# Patient Record
Sex: Female | Born: 1950 | Race: White | Hispanic: No | Marital: Single | Smoking: Never smoker
Health system: Southern US, Community
[De-identification: ages and names within clinical notes are randomized; demographics above are authoritative.]

## PROBLEM LIST (undated history)

## (undated) DIAGNOSIS — F419 Anxiety disorder, unspecified: Secondary | ICD-10-CM

## (undated) DIAGNOSIS — I8390 Asymptomatic varicose veins of unspecified lower extremity: Secondary | ICD-10-CM

## (undated) DIAGNOSIS — M1990 Unspecified osteoarthritis, unspecified site: Secondary | ICD-10-CM

## (undated) DIAGNOSIS — G629 Polyneuropathy, unspecified: Secondary | ICD-10-CM

## (undated) DIAGNOSIS — I10 Essential (primary) hypertension: Secondary | ICD-10-CM

## (undated) DIAGNOSIS — K649 Unspecified hemorrhoids: Secondary | ICD-10-CM

## (undated) DIAGNOSIS — Z8489 Family history of other specified conditions: Secondary | ICD-10-CM

## (undated) DIAGNOSIS — T4145XA Adverse effect of unspecified anesthetic, initial encounter: Secondary | ICD-10-CM

## (undated) DIAGNOSIS — T8859XA Other complications of anesthesia, initial encounter: Secondary | ICD-10-CM

## (undated) DIAGNOSIS — K219 Gastro-esophageal reflux disease without esophagitis: Secondary | ICD-10-CM

## (undated) DIAGNOSIS — R200 Anesthesia of skin: Secondary | ICD-10-CM

## (undated) DIAGNOSIS — I739 Peripheral vascular disease, unspecified: Secondary | ICD-10-CM

## (undated) DIAGNOSIS — E785 Hyperlipidemia, unspecified: Secondary | ICD-10-CM

## (undated) DIAGNOSIS — F32A Depression, unspecified: Secondary | ICD-10-CM

## (undated) DIAGNOSIS — F329 Major depressive disorder, single episode, unspecified: Secondary | ICD-10-CM

## (undated) HISTORY — DX: Hyperlipidemia, unspecified: E78.5

## (undated) HISTORY — DX: Anxiety disorder, unspecified: F41.9

## (undated) HISTORY — DX: Anesthesia of skin: R20.0

## (undated) HISTORY — DX: Asymptomatic varicose veins of unspecified lower extremity: I83.90

## (undated) HISTORY — DX: Unspecified osteoarthritis, unspecified site: M19.90

## (undated) HISTORY — DX: Essential (primary) hypertension: I10

## (undated) HISTORY — PX: MOUTH SURGERY: SHX715

## (undated) HISTORY — DX: Major depressive disorder, single episode, unspecified: F32.9

## (undated) HISTORY — DX: Unspecified hemorrhoids: K64.9

## (undated) HISTORY — DX: Depression, unspecified: F32.A

## (undated) HISTORY — DX: Polyneuropathy, unspecified: G62.9

---

## 2003-02-14 HISTORY — PX: COLONOSCOPY: SHX174

## 2013-09-04 ENCOUNTER — Other Ambulatory Visit: Payer: Self-pay | Admitting: *Deleted

## 2013-09-04 DIAGNOSIS — I83893 Varicose veins of bilateral lower extremities with other complications: Secondary | ICD-10-CM

## 2013-09-10 ENCOUNTER — Encounter: Payer: Self-pay | Admitting: Vascular Surgery

## 2013-11-12 ENCOUNTER — Encounter: Payer: Self-pay | Admitting: Vascular Surgery

## 2013-11-13 ENCOUNTER — Ambulatory Visit (INDEPENDENT_AMBULATORY_CARE_PROVIDER_SITE_OTHER): Payer: Medicare Other | Admitting: Vascular Surgery

## 2013-11-13 ENCOUNTER — Ambulatory Visit (HOSPITAL_COMMUNITY)
Admission: RE | Admit: 2013-11-13 | Discharge: 2013-11-13 | Disposition: A | Discharge status: Home / Self Care | Payer: BLUE CROSS/BLUE SHIELD | Source: Ambulatory Visit | Attending: Vascular Surgery | Admitting: Vascular Surgery

## 2013-11-13 ENCOUNTER — Encounter: Payer: Self-pay | Admitting: Vascular Surgery

## 2013-11-13 VITALS — BP 160/78 | HR 66 | Ht 66.0 in | Wt 152.0 lb

## 2013-11-13 DIAGNOSIS — I83893 Varicose veins of bilateral lower extremities with other complications: Secondary | ICD-10-CM

## 2013-11-13 DIAGNOSIS — I8393 Asymptomatic varicose veins of bilateral lower extremities: Secondary | ICD-10-CM

## 2013-11-13 NOTE — Progress Notes (Deleted)
[facility] [place] HISTORY AND PHYSICAL   History of Present Illness:  Patient is a 63 y.o. year old female who presents for evaluation of ***.  Other medical problems include  does not have a problem list on file.  Past Medical History  Diagnosis Date  . Varicose veins   . Hyperlipidemia   . Dog bite of calf     No past surgical history on file.  Social History History  Substance Use Topics  . Smoking status: Not on file  . Smokeless tobacco: Not on file  . Alcohol Use: Not on file    Family History No family history on file.  Allergies  Allergies no known allergies   Current Outpatient Prescriptions  Medication Sig Dispense Refill  . Calcium Carbonate (CALTRATE 600 PO) Take 1 tablet by mouth daily.      . pravastatin (PRAVACHOL) 10 MG tablet Take 10 mg by mouth daily.       No current facility-administered medications for this visit.    ROS:   General:  No weight loss, Fever, chills  HEENT: No recent headaches, no nasal bleeding, no visual changes, no sore throat  Neurologic: No dizziness, blackouts, seizures. No recent symptoms of stroke or mini- stroke. No recent episodes of slurred speech, or temporary blindness.  Cardiac: No recent episodes of chest pain/pressure, no shortness of breath at rest.  No shortness of breath with exertion.  Denies history of atrial fibrillation or irregular heartbeat  Vascular: No history of rest pain in feet.  No history of claudication.  No history of non-healing ulcer, No history of DVT   Pulmonary: No home oxygen, no productive cough, no hemoptysis,  No asthma or wheezing  Musculoskeletal:  [ ]  Arthritis, [ ]  Low back pain,  [ ]  Joint pain  Hematologic:No history of hypercoagulable state.  No history of easy bleeding.  No history of anemia  Gastrointestinal: No hematochezia or melena,  No gastroesophageal reflux, no trouble swallowing  Urinary: [ ]  chronic Kidney disease, [ ]  on HD - [ ]  MWF or [ ]   TTHS, [ ]  Burning with urination, [ ]  Frequent urination, [ ]  Difficulty urinating;   Skin: No rashes  Psychological: No history of anxiety,  No history of depression   Physical Examination  There were no vitals filed for this visit.  There is no height or weight on file to calculate BMI.  General:  Alert and oriented, no acute distress HEENT: Normal Neck: No bruit or JVD Pulmonary: Clear to auscultation bilaterally Cardiac: Regular Rate and Rhythm without murmur Abdomen: Soft, non-tender, non-distended, no mass, no scars Skin: No rash Extremity Pulses:  2+ radial, brachial, femoral, dorsalis pedis, posterior tibial pulses bilaterally Musculoskeletal: No deformity or edema  Neurologic: Upper and lower extremity motor 5/5 and symmetric  DATA:  ***   ASSESSMENT:  ***   PLAN:  ***  Ruta Hinds, MD [facility] [place] Office: [phone] Pager: [phone]

## 2013-11-13 NOTE — Progress Notes (Addendum)
HISTORY AND PHYSICAL     CC:  Trouble with legs, varicose veins, knee pain in right leg and restless at night.  Monico Blitz, MD  HPI: This is a 63 y.o. female who presents today for evaluation of her varicose veins.  She states that she was bitten by a dog several months ago on the right calf and her leg became red and swollen.  She went to see her PA and was told to elevate her leg and use cold compresses, which provided relief.  She states that she was also told to wear thigh high compression stockings and these are 15-20 mmHg.  She states that her right leg stays swollen and it is worse with standing.  She does have a family hx of varicose veins with her father and uncle.  She denies any hx of DVT.    She states that she does have high cholesterol and she is on a statin for this.  Past Medical History  Diagnosis Date  . Varicose veins   . Hyperlipidemia   . Dog bite of calf    History reviewed. No pertinent past surgical history.  No Known Allergies  Current Outpatient Prescriptions  Medication Sig Dispense Refill  . Calcium Carbonate (CALTRATE 600 PO) Take 1 tablet by mouth daily.      Marland Kitchen ibuprofen (ADVIL,MOTRIN) 200 MG tablet Take 200 mg by mouth every 6 (six) hours as needed.      . pravastatin (PRAVACHOL) 10 MG tablet Take 10 mg by mouth daily.       No current facility-administered medications for this visit.    Family History  Problem Relation Age of Onset  . Deep vein thrombosis Father   . Heart disease Father   . Hyperlipidemia Father   . Varicose Veins Father   . Heart attack Father   . Heart disease Mother   . Hypertension Mother   . Heart attack Mother   . Heart disease Sister     before age 110  . Hypertension Sister   . Heart attack Sister     History   Social History  . Marital Status: Single    Spouse Name: N/A    Number of Children: N/A  . Years of Education: N/A   Occupational History  . Not on file.   Social History Main Topics  .  Smoking status: Never Smoker   . Smokeless tobacco: Not on file  . Alcohol Use: No  . Drug Use: No  . Sexual Activity: Not on file   Other Topics Concern  . Not on file   Social History Narrative  . No narrative on file     ROS: [x]  Positive   [ ]  Negative   [ ]  All sytems reviewed and are negative  Cardiovascular: []  chest pain/pressure []  palpitations []  SOB lying flat []  DOE [x]  pain in legs while walking [x]  pain in feet when lying flat []  hx of DVT []  hx of phlebitis []  swelling in legs [x]  varicose veins  Pulmonary: []  productive cough []  asthma []  wheezing  Neurologic: []  weakness in []  arms []  legs xnumbness in []  arms [x]  right leg [] difficulty speaking or slurred speech []  temporary loss of vision in one eye []  dizziness  Hematologic: []  bleeding problems []  problems with blood clotting easily  GI []  vomiting blood []  blood in stool  GU: []  burning with urination []  blood in urine  Psychiatric: []  hx of major depression  Integumentary: []  rashes []   ulcers  Constitutional: []  fever []  chills   PHYSICAL EXAMINATION:  Filed Vitals:   11/13/13 1448  BP: 160/78  Pulse: 66   Body mass index is 24.55 kg/(m^2).  General:  WDWN in NAD Gait: normal gait HENT: WNL, normocephalic Pulmonary: normal non-labored breathing , without Rales, rhonchi,  wheezing Cardiac: RRR, without  Murmurs, rubs or gallops; without carotid bruits Abdomen: soft, NT, no masses Skin: without rashes, without ulcers  Vascular Exam/Pulses:  Right Left  Radial 2+ (normal) 2+ (normal)  Femoral 2+ (normal) 2+ (normal)  Popliteal Not palpated Not palpated  DP 2+ (normal) 2+ (normal)  PT 2+ (normal) absent   Extremities: without ischemic changes, without Gangrene , without cellulitis; without open wounds; right medial thigh and calf with 4-63mm varicosities; left medial thigh and calf 81mm varicosities; telangiectasias left posterior thigh as well as bilateral feet.     Musculoskeletal: no muscle wasting or atrophy  Neurologic: A&O X 3; Appropriate Affect ; SENSATION: normal; MOTOR FUNCTION:  moving all extremities equally. Speech is fluent/normal   Non-Invasive Vascular Imaging:  Lower extremity venous reflux evaluation 11/13/13: Right:  There is no deep vein obstruction; there is deep vein reflux in the CFV, SFV, and popliteal vein.  There is no superficial thrombophlebitis Left:  There is no deep vein obstruction; there is deep vein reflux in the CFV, SFV.  There is no superficial thrombophlebitis She does have evidence of superficial reflux in the greater saphenous vein bilaterally.  Pt meds includes: Statin:  Yes.   Beta Blocker:  No. Aspirin:  No. ACEI:  No. ARB:  No. Other Antiplatelet/Anticoagulant:  No.   ASSESSMENT/PLAN:: 63 y.o. female with BLE varicose veins.   -she does wear thigh high compression stockings at this time.  Dr. Oneida Alar recommends going to 30-40 mmHg thigh high stockings to help with the swelling and the pt is in agreement with this.  She will continue to elevate her legs.  She is not interested in any procedures at this time for her varicosities.  She will follow up with [place] as needed.    Leontine Locket, PA-C [facility] [phone]  Clinic MD:  Pt seen and examined in conjunction with Dr. Oneida Alar  History and exam as above. Mildly symptomatic varicose veins. However, the patient is currently happy with her current treatment with compression therapy alone. I believe she would benefit from morecompression if she will tolerate this. She was given another prescription today for tighter compression stockings. I discussed with her several options of laser ablation of her greater saphenous vein. However she prefers to use compression stockings for now. She will followup on as-needed basis.  Ruta Hinds, MD [facility] [place] Office: [phone] Pager: [phone]

## 2016-03-23 ENCOUNTER — Ambulatory Visit (HOSPITAL_COMMUNITY): Payer: Self-pay | Admitting: Psychiatry
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
## 2016-03-28 ENCOUNTER — Encounter (HOSPITAL_COMMUNITY): Payer: Self-pay | Admitting: Psychiatry

## 2016-03-28 ENCOUNTER — Ambulatory Visit (INDEPENDENT_AMBULATORY_CARE_PROVIDER_SITE_OTHER): Payer: BLUE CROSS/BLUE SHIELD | Admitting: Psychiatry

## 2016-03-28 VITALS — BP 165/96 | HR 72 | Ht 66.5 in | Wt 169.4 lb

## 2016-03-28 DIAGNOSIS — Z79899 Other long term (current) drug therapy: Secondary | ICD-10-CM | POA: Diagnosis not present

## 2016-03-28 DIAGNOSIS — Z8249 Family history of ischemic heart disease and other diseases of the circulatory system: Secondary | ICD-10-CM

## 2016-03-28 DIAGNOSIS — Z7982 Long term (current) use of aspirin: Secondary | ICD-10-CM

## 2016-03-28 DIAGNOSIS — F331 Major depressive disorder, recurrent, moderate: Secondary | ICD-10-CM

## 2016-03-28 DIAGNOSIS — Z8489 Family history of other specified conditions: Secondary | ICD-10-CM

## 2016-03-28 MED ORDER — SERTRALINE HCL 25 MG PO TABS: ORAL_TABLET | ORAL | 1 refills | Status: DC

## 2016-03-28 MED ORDER — VENLAFAXINE HCL ER 75 MG PO CP24: 75.0000 mg | ORAL_CAPSULE | Freq: Every day | ORAL | 1 refills | Status: DC

## 2016-03-28 NOTE — Patient Instructions (Addendum)
1. Start sertraline 25 mg daily for one week, then 50 mg daily 2. Discontinue Effexor 3. Return to clinic in one month 4. Contact for therapy: Dr. Alford Highland Schneidmiller  [phone] [address], [place], [place] [address]

## 2016-04-20 NOTE — Progress Notes (Deleted)
BH MD/PA/NP OP Progress Note  04/20/2016 9:33 AM Odell Fasching  MRN:  [id number]  Chief Complaint:  Subjective:  *** HPI: *** Visit Diagnosis: No diagnosis found.  Past Psychiatric History:  Outpatient: denies Psychiatry admission: denies Previous suicide attempt: denies Past trials of medication: Effexor History of violence: denies  Past Medical History:  Past Medical History:  Diagnosis Date  . Dog bite of calf   . Hyperlipidemia   . Varicose veins    No past surgical history on file.  Family Psychiatric History:  Paternal uncle- PTSD, Paternal grandfather- depression, Maternal uncle- alcohol use, Maternal grandfather- alcohol use   Family History:  Family History  Problem Relation Age of Onset  . Deep vein thrombosis Father   . Heart disease Father   . Hyperlipidemia Father   . Varicose Veins Father   . Heart attack Father   . Heart disease Mother   . Hypertension Mother   . Heart attack Mother   . Heart disease Sister     before age 18  . Hypertension Sister   . Heart attack Sister     Social History:  Social History   Social History  . Marital status: Single    Spouse name: N/A  . Number of children: N/A  . Years of education: N/A   Social History Main Topics  . Smoking status: Never Smoker  . Smokeless tobacco: Never Used  . Alcohol use No     Comment: 03-28-2016 per pt no   . Drug use: No     Comment: 03-28-2016 per pt no   . Sexual activity: Not on file   Other Topics Concern  . Not on file   Social History Narrative  . No narrative on file    Allergies: No Known Allergies  Metabolic Disorder Labs: No results found for: HGBA1C, MPG No results found for: PROLACTIN No results found for: CHOL, TRIG, HDL, CHOLHDL, VLDL, LDLCALC   Current Medications: Current Outpatient Prescriptions  Medication Sig Dispense Refill  . aspirin EC 81 MG tablet Take 81 mg by mouth daily.    . Calcium Carbonate (CALTRATE 600 PO) Take 1 tablet by mouth  daily.    Marland Kitchen ibuprofen (ADVIL,MOTRIN) 200 MG tablet Take 200 mg by mouth every 6 (six) hours as needed.    Marland Kitchen lisinopril (PRINIVIL,ZESTRIL) 10 MG tablet Take 10 mg by mouth daily.  2  . pravastatin (PRAVACHOL) 10 MG tablet Take 10 mg by mouth daily.    . sertraline (ZOLOFT) 25 MG tablet Take 25 mg daily for one week, then 50 mg daily 60 tablet 1   No current facility-administered medications for this visit.     Neurologic: Headache: No Seizure: No Paresthesias: No  Musculoskeletal: Strength & Muscle Tone: within normal limits Gait & Station: normal Patient leans: N/A  Psychiatric Specialty Exam: ROS  There were no vitals taken for this visit.There is no height or weight on file to calculate BMI.  General Appearance: Fairly Groomed  Eye Contact:  Good  Speech:  Clear and Coherent  Volume:  Normal  Mood:  {BHH MOOD:22306}  Affect:  {Affect (PAA):22687}  Thought Process:  Coherent and Goal Directed  Orientation:  Full (Time, Place, and Person)  Thought Content: Logical   Suicidal Thoughts:  {ST/HT (PAA):22692}  Homicidal Thoughts:  {ST/HT (PAA):22692}  Memory:  Immediate;   Good Recent;   Good Remote;   Good  Judgement:  {Judgement (PAA):22694}  Insight:  {Insight (PAA):22695}  Psychomotor Activity:  Normal  Concentration:  Concentration: Good and Attention Span: Good  Recall:  Good  Fund of Knowledge: Good  Language: Good  Akathisia:  No  Handed:  Right  AIMS (if indicated):  N/A  Assets:  Communication Skills Desire for Improvement  ADL's:  Intact  Cognition: WNL  Sleep:  ***   Assessment Whitney Vaughan is  66 year old female with depression, anxiety, hypertension, varicose vein, who is referred for depression.   # MDD Patient endorses neurovegetative symptoms in the setting of death of her sister in 2015/09/27, who she had unresolved conflict. Although she did have some benefit from Effexor, will switch to sertraline given her history of hypertension. Discussed  serotonin withdrawal symptoms and side effect of nausea, vomiting. Patient will greatly benefit from support therapy to process her loss; referral information provided.  Plan 1. Start sertraline 25 mg daily for one week, then 50 mg daily 2. Discontinue Effexor 3. Return to clinic in one month 4. Contact for therapy: Dr. Alford Highland Schneidmiller  [phone] [address] [place], [place] [address]  The patient demonstrates the following risk factors for suicide: Chronic risk factors for suicide include: psychiatric disorder of depression. Acute risk factors for suicide include: family or marital conflict and loss (financial, interpersonal, professional). Protective factors for this patient include: coping skills and hope for the future. Considering these factors, the overall suicide risk at this point appears to be low. Patient is appropriate for outpatient follow up.   Treatment Plan Summary: Plan as above  Norman Clay, MD 04/20/2016, 9:33 AM

## 2016-04-25 ENCOUNTER — Ambulatory Visit (HOSPITAL_COMMUNITY): Payer: Self-pay | Admitting: Psychiatry

## 2016-04-25 NOTE — Progress Notes (Deleted)
[facility] MD/PA/NP OP Progress Note  04/25/2016 10:28 AM Whitney Vaughan  MRN:  [id number]  Chief Complaint:  Subjective:  *** HPI: *** Visit Diagnosis: No diagnosis found.  Past Psychiatric History:  Outpatient: denies Psychiatry admission: denies Previous suicide attempt: denies Past trials of medication: Effexor History of violence: denies  Past Medical History:  Past Medical History:  Diagnosis Date  . Dog bite of calf   . Hyperlipidemia   . Varicose veins    No past surgical history on file.  Family Psychiatric History:  Paternal uncle- PTSD, Paternal grandfather- depression, Maternal uncle- alcohol use, Maternal grandfather- alcohol use   Family History:  Family History  Problem Relation Age of Onset  . Deep vein thrombosis Father   . Heart disease Father   . Hyperlipidemia Father   . Varicose Veins Father   . Heart attack Father   . Heart disease Mother   . Hypertension Mother   . Heart attack Mother   . Heart disease Sister     before age 6  . Hypertension Sister   . Heart attack Sister     Social History:  Social History   Social History  . Marital status: Single    Spouse name: N/A  . Number of children: N/A  . Years of education: N/A   Social History Main Topics  . Smoking status: Never Smoker  . Smokeless tobacco: Never Used  . Alcohol use No     Comment: 03-28-2016 per pt no   . Drug use: No     Comment: 03-28-2016 per pt no   . Sexual activity: Not on file   Other Topics Concern  . Not on file   Social History Narrative  . No narrative on file   Single, no children. Lives by her self with her cat.  From [place], moved to [place] where her mother grew up,  Education: High school Work: used to work in [employer], last in 2001,  She reports her childhood as "pretty good" until her sister "ran away" from the house for marriage  Allergies: No Known Allergies  Metabolic Disorder Labs: No results found for: HGBA1C, MPG No  results found for: PROLACTIN No results found for: CHOL, TRIG, HDL, CHOLHDL, VLDL, LDLCALC   Current Medications: Current Outpatient Prescriptions  Medication Sig Dispense Refill  . aspirin EC 81 MG tablet Take 81 mg by mouth daily.    . Calcium Carbonate (CALTRATE 600 PO) Take 1 tablet by mouth daily.    Marland Kitchen ibuprofen (ADVIL,MOTRIN) 200 MG tablet Take 200 mg by mouth every 6 (six) hours as needed.    Marland Kitchen lisinopril (PRINIVIL,ZESTRIL) 10 MG tablet Take 10 mg by mouth daily.  2  . pravastatin (PRAVACHOL) 10 MG tablet Take 10 mg by mouth daily.    . sertraline (ZOLOFT) 25 MG tablet Take 25 mg daily for one week, then 50 mg daily 60 tablet 1   No current facility-administered medications for this visit.     Neurologic: Headache: No Seizure: No Paresthesias: No  Musculoskeletal: Strength & Muscle Tone: within normal limits Gait & Station: normal Patient leans: N/A  Psychiatric Specialty Exam: ROS  There were no vitals taken for this visit.There is no height or weight on file to calculate BMI.  General Appearance: Fairly Groomed  Eye Contact:  Good  Speech:  Clear and Coherent  Volume:  Normal  Mood:  {BHH MOOD:22306}  Affect:  {Affect (PAA):22687}  Thought Process:  Coherent and Goal Directed  Orientation:  Full (Time, Place, and Person)  Thought Content: Logical   Suicidal Thoughts:  {ST/HT (PAA):22692}  Homicidal Thoughts:  {ST/HT (PAA):22692}  Memory:  Immediate;   Good Recent;   Good Remote;   Good  Judgement:  {Judgement (PAA):22694}  Insight:  {Insight (PAA):22695}  Psychomotor Activity:  Normal  Concentration:  Concentration: Good and Attention Span: Good  Recall:  Good  Fund of Knowledge: Good  Language: Good  Akathisia:  No  Handed:  Right  AIMS (if indicated):  N/A  Assets:  Communication Skills Desire for Improvement  ADL's:  Intact  Cognition: WNL  Sleep:  ***   Assessment Whitney Vaughan is  66 year old female with depression, anxiety, hypertension,  varicose vein, who is referred for depression.   # MDD Patient endorses neurovegetative symptoms in the setting of death of her sister in 2015-10-12, who she had unresolved conflict. Although she did have some benefit from Effexor, will switch to sertraline given her history of hypertension. Discussed serotonin withdrawal symptoms and side effect of nausea, vomiting. Patient will greatly benefit from support therapy to process her loss; referral information provided.  Plan 1. Start sertraline 25 mg daily for one week, then 50 mg daily 2. Discontinue Effexor 3. Return to clinic in one month 4. Contact for therapy: Dr. Alford Highland Schneidmiller  [phone] [address] [place], [place] [address]  The patient demonstrates the following risk factors for suicide: Chronic risk factors for suicide include: psychiatric disorder of depression. Acute risk factors for suicide include: family or marital conflict and loss (financial, interpersonal, professional). Protective factors for this patient include: coping skills and hope for the future. Considering these factors, the overall suicide risk at this point appears to be low. Patient is appropriate for outpatient follow up.   Treatment Plan Summary: Plan as above  Norman Clay, MD 04/25/2016, 10:28 AM

## 2016-05-25 NOTE — Progress Notes (Signed)
Whitney Vaughan  MRN:  [id number]  Chief Complaint:  Chief Complaint    Follow-up; Depression     Subjective:  "I need medication" HPI:  She presents for follow-up appointment. She states that she had discontinued sertraline once, as she thought she was doing better. She noticed that her depression got worse and had crying spells when she does not take medication. She restarted her medications and takes 50 mg daily. She does house chores and goes to friend meeting. There is a friend with whom advised her to get outside of her comfort zone; she made Dominic no and did eat out by herself. She feels good about these. She feels frustrated that she is not able to do crochet as she used to due to visual impairment. She reports therapy to be very helpful. She denies SI. She denies insomnia. The patient denies any side effect after starting sertraline.   Visit Diagnosis:    ICD-9-CM ICD-10-CM   1. Major depressive disorder, recurrent episode, moderate (HCC) 296.32 F33.1     Past Psychiatric History:  Outpatient: denies Psychiatry admission: denies Previous suicide attempt: denies Past trials of medication: Effexor History of violence: denies  Past Medical History:  Past Medical History:  Diagnosis Date  . Dog bite of calf   . Hyperlipidemia   . Varicose veins    No past surgical history on file.  Family Psychiatric History:  Paternal uncle- PTSD, Paternal grandfather- depression, Maternal uncle- alcohol use, Maternal grandfather- alcohol use   Family History:  Family History  Problem Relation Age of Onset  . Deep vein thrombosis Father   . Heart disease Father   . Hyperlipidemia Father   . Varicose Veins Father   . Heart attack Father   . Heart disease Mother   . Hypertension Mother   . Heart attack Mother   . Heart disease Sister     before age 29  . Hypertension Sister   . Heart attack Sister     Social History:   Social History   Social History  . Marital status: Single    Spouse name: N/A  . Number of children: N/A  . Years of education: N/A   Social History Main Topics  . Smoking status: Never Smoker  . Smokeless tobacco: Never Used  . Alcohol use No     Comment: 03-28-2016 per pt no   . Drug use: No     Comment: 03-28-2016 per pt no   . Sexual activity: Not Asked   Other Topics Concern  . None   Social History Narrative  . None   Single, no children. Lives by her self with her cat.  From [place], moved to [place] where her mother grew up,  Education: High school Work: used to work in [employer], last in 2001,  She reports her childhood as "pretty good" until her sister "ran away" from the house for marriage  Allergies: No Known Allergies  Metabolic Disorder Labs: No results found for: HGBA1C, MPG No results found for: PROLACTIN No results found for: CHOL, TRIG, HDL, CHOLHDL, VLDL, LDLCALC   Current Medications: Current Outpatient Prescriptions  Medication Sig Dispense Refill  . aspirin EC 81 MG tablet Take 81 mg by mouth daily.    . Calcium Carbonate (CALTRATE 600 PO) Take 1 tablet by mouth daily.    Marland Kitchen ibuprofen (ADVIL,MOTRIN) 200 MG tablet Take 200 mg by mouth every 6 (six) hours as needed.    Marland Kitchen  lisinopril (PRINIVIL,ZESTRIL) 10 MG tablet Take 10 mg by mouth daily.  2  . pravastatin (PRAVACHOL) 10 MG tablet Take 10 mg by mouth daily.    . sertraline (ZOLOFT) 50 MG tablet Take 1 tablet (50 mg total) by mouth daily. 30 tablet 1   No current facility-administered medications for this visit.     Neurologic: Headache: No Seizure: No Paresthesias: No  Musculoskeletal: Strength & Muscle Tone: within normal limits Gait & Station: normal Patient leans: N/A  Psychiatric Specialty Exam: Review of Systems  Psychiatric/Behavioral: Positive for depression. Negative for hallucinations, substance abuse and suicidal ideas. The patient is nervous/anxious. The patient  does not have insomnia.   All other systems reviewed and are negative.   Blood pressure 140/66, pulse 66, height 5' 6.5" (1.689 m), weight 170 lb (77.1 kg).Body mass index is 27.03 kg/m.  General Appearance: Fairly Groomed  Eye Contact:  Good  Speech:  Clear and Coherent  Volume:  Normal  Mood:  "better"  Affect:  slightly restricted  Thought Process:  Coherent and Goal Directed  Orientation:  Full (Time, Place, and Person)  Thought Content: Logical  Perceptions: denies AH/VH  Suicidal Thoughts:  No  Homicidal Thoughts:  No  Memory:  Immediate;   Good Recent;   Good Remote;   Good  Judgement:  Good  Insight:  Fair  Psychomotor Activity:  Normal  Concentration:  Concentration: Good and Attention Span: Good  Recall:  Good  Fund of Knowledge: Good  Language: Good  Akathisia:  No  Handed:  Right  AIMS (if indicated):  N/A  Assets:  Communication Skills Desire for Improvement  ADL's:  Intact  Cognition: WNL  Sleep:  good   Assessment Whitney Vaughan is  66 year old female with depression, anxiety, hypertension, varicose vein, who presents for follow up appointment for depression. Psychosocial stressors including death of her sister in 12-Oct-2015, who she had unresolved conflict. A  # MDD Patient reports improvement in her neurovegetative symptoms since cross tapering from Effexor to sertraline. Will continue current dose. Patient is advised to continue to see her therapist.   Plan 1. Continue sertraline 50 mg daily 2. Return to clinic in one month 3. Patient to continue to see  Dr. Alford Highland Schneidmiller  The patient demonstrates the following risk factors for suicide: Chronic risk factors for suicide include: psychiatric disorder of depression. Acute risk factors for suicide include: family or marital conflict and loss (financial, interpersonal, professional). Protective factors for this patient include: coping skills and hope for the future. Considering these factors, the  overall suicide risk at this point appears to be low. Patient is appropriate for outpatient follow up.  Treatment Plan Summary: Plan as above  Norman Clay, MD 05/26/2016, 12:05 PM

## 2016-05-26 ENCOUNTER — Encounter (HOSPITAL_COMMUNITY): Payer: Self-pay | Admitting: Psychiatry

## 2016-05-26 ENCOUNTER — Ambulatory Visit (INDEPENDENT_AMBULATORY_CARE_PROVIDER_SITE_OTHER): Payer: BLUE CROSS/BLUE SHIELD | Admitting: Psychiatry

## 2016-05-26 VITALS — BP 140/66 | HR 66 | Ht 66.5 in | Wt 170.0 lb

## 2016-05-26 DIAGNOSIS — F331 Major depressive disorder, recurrent, moderate: Secondary | ICD-10-CM

## 2016-05-26 DIAGNOSIS — Z7982 Long term (current) use of aspirin: Secondary | ICD-10-CM | POA: Diagnosis not present

## 2016-05-26 DIAGNOSIS — Z79899 Other long term (current) drug therapy: Secondary | ICD-10-CM | POA: Diagnosis not present

## 2016-05-26 MED ORDER — SERTRALINE HCL 50 MG PO TABS: 50.0000 mg | ORAL_TABLET | Freq: Every day | ORAL | 1 refills | Status: DC

## 2016-05-26 NOTE — Patient Instructions (Signed)
1. Continue sertraline 50 mg daily 2. Return to clinic in one month

## 2016-06-23 ENCOUNTER — Ambulatory Visit (INDEPENDENT_AMBULATORY_CARE_PROVIDER_SITE_OTHER): Payer: BLUE CROSS/BLUE SHIELD | Admitting: Psychiatry

## 2016-06-23 VITALS — BP 145/70 | HR 72 | Ht 66.14 in | Wt 174.2 lb

## 2016-06-23 DIAGNOSIS — Z811 Family history of alcohol abuse and dependence: Secondary | ICD-10-CM

## 2016-06-23 DIAGNOSIS — Z818 Family history of other mental and behavioral disorders: Secondary | ICD-10-CM | POA: Diagnosis not present

## 2016-06-23 DIAGNOSIS — F331 Major depressive disorder, recurrent, moderate: Secondary | ICD-10-CM

## 2016-06-23 DIAGNOSIS — F419 Anxiety disorder, unspecified: Secondary | ICD-10-CM

## 2016-06-23 DIAGNOSIS — Z79899 Other long term (current) drug therapy: Secondary | ICD-10-CM | POA: Diagnosis not present

## 2016-06-23 DIAGNOSIS — I10 Essential (primary) hypertension: Secondary | ICD-10-CM

## 2016-06-23 DIAGNOSIS — I8390 Asymptomatic varicose veins of unspecified lower extremity: Secondary | ICD-10-CM | POA: Diagnosis not present

## 2016-06-23 MED ORDER — SERTRALINE HCL 50 MG PO TABS: 50.0000 mg | ORAL_TABLET | Freq: Every day | ORAL | 0 refills | Status: DC

## 2016-06-23 MED ORDER — SERTRALINE HCL 50 MG PO TABS: 50.0000 mg | ORAL_TABLET | Freq: Every day | ORAL | 2 refills | Status: DC

## 2016-06-23 NOTE — Progress Notes (Signed)
[facility] MD/PA/NP OP Progress Note  06/23/2016 10:03 AM Whitney Vaughan  MRN:  [id number]  Chief Complaint:  Chief Complaint    Depression; Follow-up     Subjective:  "I feel a lot better" HPI:  She presents for follow-up appointment. She states that she feels much better since the last appointment. Her therapist advised her to come back as needed. She thinks medication and processing with her therapist helped her. She does not dwell in the past anymore and she feels relieved that her sister's daughter has not contacted with patient anymore. She helps her friend, age 11 year old and visits her home every day. She is trying to find some time to get back on exercise. She enjoys [employer]. She denies insomnia. She denies SI. She denies anxiety.    Visit Diagnosis:    ICD-9-CM ICD-10-CM   1. Major depressive disorder, recurrent episode, moderate (HCC) 296.32 F33.1     Past Psychiatric History:  Outpatient: denies Psychiatry admission: denies Previous suicide attempt: denies Past trials of medication: Effexor History of violence: denies  Past Medical History:  Past Medical History:  Diagnosis Date  . Dog bite of calf   . Hyperlipidemia   . Varicose veins    No past surgical history on file.  Family Psychiatric History:  Paternal uncle- PTSD, Paternal grandfather- depression, Maternal uncle- alcohol use, Maternal grandfather- alcohol use   Family History:  Family History  Problem Relation Age of Onset  . Deep vein thrombosis Father   . Heart disease Father   . Hyperlipidemia Father   . Varicose Veins Father   . Heart attack Father   . Heart disease Mother   . Hypertension Mother   . Heart attack Mother   . Heart disease Sister        before age 40  . Hypertension Sister   . Heart attack Sister     Social History:  Social History   Social History  . Marital status: Single    Spouse name: N/A  . Number of children: N/A  . Years of education: N/A   Social History Main  Topics  . Smoking status: Never Smoker  . Smokeless tobacco: Never Used  . Alcohol use No     Comment: 03-28-2016 per pt no   . Drug use: No     Comment: 03-28-2016 per pt no   . Sexual activity: Not on file   Other Topics Concern  . Not on file   Social History Narrative  . No narrative on file   Single, no children. Lives by her self with her cat.  From [place], moved to [place] where her mother grew up,  Education: High school Work: used to work in [employer], last in 2001,  She reports her childhood as "pretty good" until her sister "ran away" from the house for marriage  Allergies: No Known Allergies  Metabolic Disorder Labs: No results found for: HGBA1C, MPG No results found for: PROLACTIN No results found for: CHOL, TRIG, HDL, CHOLHDL, VLDL, LDLCALC   Current Medications: Current Outpatient Prescriptions  Medication Sig Dispense Refill  . aspirin EC 81 MG tablet Take 81 mg by mouth daily.    . Calcium Carbonate (CALTRATE 600 PO) Take 1 tablet by mouth daily.    Marland Kitchen ibuprofen (ADVIL,MOTRIN) 200 MG tablet Take 200 mg by mouth every 6 (six) hours as needed.    Marland Kitchen lisinopril (PRINIVIL,ZESTRIL) 10 MG tablet Take 10 mg by mouth daily.  2  . pravastatin (  PRAVACHOL) 10 MG tablet Take 10 mg by mouth daily.    . sertraline (ZOLOFT) 50 MG tablet Take 1 tablet (50 mg total) by mouth daily. 90 tablet 0   No current facility-administered medications for this visit.     Neurologic: Headache: No Seizure: No Paresthesias: No  Musculoskeletal: Strength & Muscle Tone: within normal limits Gait & Station: normal Patient leans: N/A  Psychiatric Specialty Exam: Review of Systems  Psychiatric/Behavioral: Positive for depression. Negative for hallucinations, substance abuse and suicidal ideas. The patient is nervous/anxious. The patient does not have insomnia.   All other systems reviewed and are negative.   Blood pressure (!) 145/70, pulse 72, height 5' 6.14" (1.68 m),  weight 174 lb 3.2 oz (79 kg).Body mass index is 28 kg/m.  General Appearance: Fairly Groomed  Eye Contact:  Good  Speech:  Clear and Coherent  Volume:  Normal  Mood:  "good"  Affect:  Appropriate and Congruent  Thought Process:  Coherent and Goal Directed  Orientation:  Full (Time, Place, and Person)  Thought Content: Logical  Perceptions: denies AH/VH  Suicidal Thoughts:  No  Homicidal Thoughts:  No  Memory:  Immediate;   Good Recent;   Good Remote;   Good  Judgement:  Good  Insight:  Good  Psychomotor Activity:  Normal  Concentration:  Concentration: Good and Attention Span: Good  Recall:  Good  Fund of Knowledge: Good  Language: Good  Akathisia:  No  Handed:  Right  AIMS (if indicated):  N/A  Assets:  Communication Skills Desire for Improvement  ADL's:  Intact  Cognition: WNL  Sleep:  good   Assessment Whitney Vaughan is  66 year old female with depression, anxiety, hypertension, varicose vein, who presents for follow up appointment for depression. Psychosocial stressors including death of her sister in 10-04-2015, who she had unresolved conflict.   # MDD Exam is notable for her significant improvement in her neurovegetative symptoms since cross tapering from Effexor to sertraline. Will continue sertraline to target depression. Dicussed behavioral activation. She completed a therapy and was advised to continue prn. She is advised to make earlier appointment if she has any worsening in her symptoms.   Plan 1. Continue sertraline 50 mg daily 2. Return to clinic in three months   The patient demonstrates the following risk factors for suicide: Chronic risk factors for suicide include: psychiatric disorder of depression. Acute risk factors for suicide include: family or marital conflict and loss (financial, interpersonal, professional). Protective factors for this patient include: coping skills and hope for the future. Considering these factors, the overall suicide risk at  this point appears to be low. Patient is appropriate for outpatient follow up.  Treatment Plan Summary: Plan as above  Norman Clay, MD 06/23/2016, 10:03 AM

## 2016-06-23 NOTE — Patient Instructions (Addendum)
1. Continue sertraline 50 mg daily 2. Return to clinic in three months

## 2016-09-19 NOTE — Progress Notes (Signed)
[facility] MD/PA/NP OP Progress Note  09/22/2016 12:32 PM Whitney Vaughan  MRN:  [id number]  Chief Complaint:  Chief Complaint    Depression; Follow-up     Subjective:  "I'm doing better" HPI:  Patient presents for follow up appointment for depression. She states that she has been feeling better since the last appointment. She went out eating at [employer], and went watching a movie by herself. She enjoyed attending a party the other day. She does not feel disturbed about the death of her sister anymore. She feels hypersomnolence and sleeps six hours and takes a nap during the day. She feels fatigue at time. She denies SI, HI, AH/VH. She denies anxiety or panic attacks.  \  Visit Diagnosis:    ICD-10-CM   1. Major depressive disorder, recurrent episode, moderate (HCC) F33.1     Past Psychiatric History:  I have reviewed the patient's psychiatry history in detail and updated the patient record. Outpatient: denies Psychiatry admission: denies Previous suicide attempt: denies Past trials of medication: Effexor History of violence: denies  Past Medical History:  Past Medical History:  Diagnosis Date  . Dog bite of calf   . Hyperlipidemia   . Varicose veins    No past surgical history on file.  Family Psychiatric History:  I have reviewed the patient's family history in detail and updated the patient record.  Family History:  Family History  Problem Relation Age of Onset  . Deep vein thrombosis Father   . Heart disease Father   . Hyperlipidemia Father   . Varicose Veins Father   . Heart attack Father   . Heart disease Mother   . Hypertension Mother   . Heart attack Mother   . Heart disease Sister        before age 60  . Hypertension Sister   . Heart attack Sister     Social History:  Social History   Social History  . Marital status: Single    Spouse name: N/A  . Number of children: N/A  . Years of education: N/A   Social History Main Topics  . Smoking status:  Never Smoker  . Smokeless tobacco: Never Used  . Alcohol use No     Comment: 03-28-2016 per pt no   . Drug use: No     Comment: 03-28-2016 per pt no   . Sexual activity: Not Asked   Other Topics Concern  . None   Social History Narrative  . None    Allergies: No Known Allergies  Metabolic Disorder Labs: No results found for: HGBA1C, MPG No results found for: PROLACTIN No results found for: CHOL, TRIG, HDL, CHOLHDL, VLDL, LDLCALC   Current Medications: Current Outpatient Prescriptions  Medication Sig Dispense Refill  . aspirin EC 81 MG tablet Take 81 mg by mouth daily.    . Calcium Carbonate (CALTRATE 600 PO) Take 1 tablet by mouth daily.    Marland Kitchen ibuprofen (ADVIL,MOTRIN) 200 MG tablet Take 200 mg by mouth every 6 (six) hours as needed.    Marland Kitchen lisinopril (PRINIVIL,ZESTRIL) 10 MG tablet Take 10 mg by mouth daily.  2  . Multiple Vitamins-Minerals (PRESERVISION AREDS 2 PO) Take 2 mg by mouth 2 (two) times daily.    . pravastatin (PRAVACHOL) 10 MG tablet Take 10 mg by mouth daily.    . sertraline (ZOLOFT) 50 MG tablet Take 1 tablet (50 mg total) by mouth daily. 90 tablet 1   No current facility-administered medications for this visit.  Neurologic: Headache: No Seizure: No Paresthesias: No  Musculoskeletal: Strength & Muscle Tone: within normal limits Gait & Station: normal Patient leans: N/A  Psychiatric Specialty Exam: Review of Systems  Psychiatric/Behavioral: Positive for depression. Negative for hallucinations, substance abuse and suicidal ideas. The patient is not nervous/anxious and does not have insomnia.   All other systems reviewed and are negative.   Blood pressure 138/63, pulse (!) 58, height 5\' 6"  (1.676 m), weight 177 lb 6.4 oz (80.5 kg).Body mass index is 28.63 kg/m.  General Appearance: Fairly Groomed  Eye Contact:  Good  Speech:  Clear and Coherent  Volume:  Normal  Mood:  "good"  Affect:  Appropriate, Congruent and slightly down, but reactive  Thought  Process:  Coherent and Goal Directed  Orientation:  Full (Time, Place, and Person)  Thought Content: Logical Perceptions: denies AH/VH  Suicidal Thoughts:  No  Homicidal Thoughts:  No  Memory:  Immediate;   Good Recent;   Good Remote;   Good  Judgement:  Good  Insight:  Fair  Psychomotor Activity:  Normal  Concentration:  Concentration: Good and Attention Span: Good  Recall:  Good  Fund of Knowledge: Good  Language: Good  Akathisia:  No  Handed:  Right  AIMS (if indicated):  N/A  Assets:  Communication Skills Desire for Improvement  ADL's:  Intact  Cognition: WNL  Sleep:  Hypersomnia (six hours)   Assessment Whitney Vaughan is a 66 y.o. year old female with a history of depression, anxiety, hypertension, varicose vein, who presents for follow up appointment for Major depressive disorder, recurrent episode, moderate ([place]). Psychosocial stressors including death of her sister in 2015-10-08, who she had unresolved conflict.   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 Will continue current dose to target depression. Discussed behavioral activation and sleep hygiene. Also discussed that she is recommended to continue medication 9-12 months to avoid any relapse.   Plan 1. Continue sertraline 50 mg daily 2. Return to clinic in four months  The patient demonstrates the following risk factors for suicide: Chronic risk factors for suicide include: psychiatric disorder of depression. Acute risk factorsfor suicide include: family or marital conflict and loss (financial, interpersonal, professional). Protective factorsfor this patient include: coping skills and hope for the future. Considering these factors, the overall suicide risk at this point appears to be low. Patient  isappropriate for outpatient follow up.  Treatment Plan Summary:Plan as above   Norman Clay, MD 09/22/2016, 12:32 PM

## 2016-09-22 ENCOUNTER — Encounter (HOSPITAL_COMMUNITY): Payer: Self-pay | Admitting: Psychiatry

## 2016-09-22 ENCOUNTER — Ambulatory Visit (INDEPENDENT_AMBULATORY_CARE_PROVIDER_SITE_OTHER): Payer: BLUE CROSS/BLUE SHIELD | Admitting: Psychiatry

## 2016-09-22 VITALS — BP 138/63 | HR 58 | Ht 66.0 in | Wt 177.4 lb

## 2016-09-22 DIAGNOSIS — F419 Anxiety disorder, unspecified: Secondary | ICD-10-CM | POA: Diagnosis not present

## 2016-09-22 DIAGNOSIS — I10 Essential (primary) hypertension: Secondary | ICD-10-CM

## 2016-09-22 DIAGNOSIS — F331 Major depressive disorder, recurrent, moderate: Secondary | ICD-10-CM

## 2016-09-22 DIAGNOSIS — I8390 Asymptomatic varicose veins of unspecified lower extremity: Secondary | ICD-10-CM | POA: Diagnosis not present

## 2016-09-22 MED ORDER — SERTRALINE HCL 50 MG PO TABS: 50.0000 mg | ORAL_TABLET | Freq: Every day | ORAL | 1 refills | Status: DC

## 2016-09-22 NOTE — Patient Instructions (Signed)
1. Continue sertraline 50 mg daily 2. Return to clinic in four months

## 2017-01-19 NOTE — Progress Notes (Deleted)
[place] MD/PA/NP OP Progress Note  01/19/2017 10:02 AM Whitney Vaughan  MRN:  [id number]  Chief Complaint:  HPI: *** Visit Diagnosis: No diagnosis found.  Past Psychiatric History:  I have reviewed the patient's psychiatry history in detail and updated the patient record. Outpatient: denies Psychiatry admission: denies Previous suicide attempt: denies Past trials of medication: Effexor History of violence: denies    Past Medical History:  Past Medical History:  Diagnosis Date  . Dog bite of calf   . Hyperlipidemia   . Varicose veins    No past surgical history on file.  Family Psychiatric History: I have reviewed the patient's family history in detail and updated the patient record.  Family History:  Family History  Problem Relation Age of Onset  . Deep vein thrombosis Father   . Heart disease Father   . Hyperlipidemia Father   . Varicose Veins Father   . Heart attack Father   . Heart disease Mother   . Hypertension Mother   . Heart attack Mother   . Heart disease Sister        before age 36  . Hypertension Sister   . Heart attack Sister     Social History:  Social History   Socioeconomic History  . Marital status: Single    Spouse name: Not on file  . Number of children: Not on file  . Years of education: Not on file  . Highest education level: Not on file  Social Needs  . Financial resource strain: Not on file  . Food insecurity - worry: Not on file  . Food insecurity - inability: Not on file  . Transportation needs - medical: Not on file  . Transportation needs - non-medical: Not on file  Occupational History  . Not on file  Tobacco Use  . Smoking status: Never Smoker  . Smokeless tobacco: Never Used  Substance and Sexual Activity  . Alcohol use: No    Comment: 03-28-2016 per pt no   . Drug use: No    Comment: 03-28-2016 per pt no   . Sexual activity: Not on file  Other Topics Concern  . Not on file  Social History Narrative  . Not on file     Allergies: No Known Allergies  Metabolic Disorder Labs: No results found for: HGBA1C, MPG No results found for: PROLACTIN No results found for: CHOL, TRIG, HDL, CHOLHDL, VLDL, LDLCALC No results found for: TSH  Therapeutic Level Labs: No results found for: LITHIUM No results found for: VALPROATE No components found for:  CBMZ  Current Medications: Current Outpatient Medications  Medication Sig Dispense Refill  . aspirin EC 81 MG tablet Take 81 mg by mouth daily.    . Calcium Carbonate (CALTRATE 600 PO) Take 1 tablet by mouth daily.    Marland Kitchen ibuprofen (ADVIL,MOTRIN) 200 MG tablet Take 200 mg by mouth every 6 (six) hours as needed.    Marland Kitchen lisinopril (PRINIVIL,ZESTRIL) 10 MG tablet Take 10 mg by mouth daily.  2  . Multiple Vitamins-Minerals (PRESERVISION AREDS 2 PO) Take 2 mg by mouth 2 (two) times daily.    . pravastatin (PRAVACHOL) 10 MG tablet Take 10 mg by mouth daily.    . sertraline (ZOLOFT) 50 MG tablet Take 1 tablet (50 mg total) by mouth daily. 90 tablet 1   No current facility-administered medications for this visit.      Musculoskeletal: Strength & Muscle Tone: within normal limits Gait & Station: normal Patient leans: N/A  Psychiatric Specialty Exam:  ROS  There were no vitals taken for this visit.There is no height or weight on file to calculate BMI.  General Appearance: Fairly Groomed  Eye Contact:  Good  Speech:  Clear and Coherent  Volume:  Normal  Mood:  {BHH MOOD:22306}  Affect:  {Affect (PAA):22687}  Thought Process:  Coherent and Goal Directed  Orientation:  Full (Time, Place, and Person)  Thought Content: Logical   Suicidal Thoughts:  {ST/HT (PAA):22692}  Homicidal Thoughts:  {ST/HT (PAA):22692}  Memory:  Immediate;   Good Recent;   Good Remote;   Good  Judgement:  {Judgement (PAA):22694}  Insight:  {Insight (PAA):22695}  Psychomotor Activity:  Normal  Concentration:  Concentration: Good and Attention Span: Good  Recall:  Good  Fund of  Knowledge: Good  Language: Good  Akathisia:  No  Handed:  Right  AIMS (if indicated): not done  Assets:  Communication Skills Desire for Improvement  ADL's:  Intact  Cognition: WNL  Sleep:  {BHH GOOD/FAIR/POOR:22877}   Screenings:   Assessment and Plan:  Whitney Vaughan is a 66 y.o. year old female with a history of depression, anxiety, hypertension, varicose vein , who presents for follow up appointment for No diagnosis found. Psychosocial stressors including death of her sister in Sep 16, 2015, who she had unresolved conflict.   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 Will continue current dose to target depression. Discussed behavioral activation and sleep hygiene. Also discussed that she is recommended to continue medication 9-12 months to avoid any relapse.   Plan 1. Continue sertraline 50 mg daily 2. Return to clinic in four months  The patient demonstrates the following risk factors for suicide: Chronic risk factors for suicide include: psychiatric disorder of depression. Acute risk factorsfor suicide include: family or marital conflict and loss (financial, interpersonal, professional). Protective factorsfor this patient include: coping skills and hope for the future. Considering these factors, the overall suicide risk at this point appears to be low. Patient isappropriate for outpatient follow up.   Norman Clay, MD 01/19/2017, 10:02 AM

## 2017-01-23 ENCOUNTER — Ambulatory Visit (HOSPITAL_COMMUNITY): Payer: BLUE CROSS/BLUE SHIELD | Admitting: Psychiatry

## 2017-02-20 NOTE — Progress Notes (Deleted)
[facility] MD/PA/NP OP Progress Note  02/20/2017 2:45 PM Whitney Vaughan  MRN:  [id number]  Chief Complaint:  HPI: *** Visit Diagnosis: No diagnosis found.  Past Psychiatric History:  I have reviewed the patient's psychiatry history in detail and updated the patient record. Outpatient: denies Psychiatry admission: denies Previous suicide attempt: denies Past trials of medication: Effexor History of violence: denies  Past Medical History:  Past Medical History:  Diagnosis Date  . Dog bite of calf   . Hyperlipidemia   . Varicose veins    No past surgical history on file.  Family Psychiatric History: I have reviewed the patient's family history in detail and updated the patient record.  Family History:  Family History  Problem Relation Age of Onset  . Deep vein thrombosis Father   . Heart disease Father   . Hyperlipidemia Father   . Varicose Veins Father   . Heart attack Father   . Heart disease Mother   . Hypertension Mother   . Heart attack Mother   . Heart disease Sister        before age 33  . Hypertension Sister   . Heart attack Sister     Social History:  Social History   Socioeconomic History  . Marital status: Single    Spouse name: Not on file  . Number of children: Not on file  . Years of education: Not on file  . Highest education level: Not on file  Social Needs  . Financial resource strain: Not on file  . Food insecurity - worry: Not on file  . Food insecurity - inability: Not on file  . Transportation needs - medical: Not on file  . Transportation needs - non-medical: Not on file  Occupational History  . Not on file  Tobacco Use  . Smoking status: Never Smoker  . Smokeless tobacco: Never Used  Substance and Sexual Activity  . Alcohol use: No    Comment: 03-28-2016 per pt no   . Drug use: No    Comment: 03-28-2016 per pt no   . Sexual activity: Not on file  Other Topics Concern  . Not on file  Social History Narrative  . Not on file     Allergies: No Known Allergies  Metabolic Disorder Labs: No results found for: HGBA1C, MPG No results found for: PROLACTIN No results found for: CHOL, TRIG, HDL, CHOLHDL, VLDL, LDLCALC No results found for: TSH  Therapeutic Level Labs: No results found for: LITHIUM No results found for: VALPROATE No components found for:  CBMZ  Current Medications: Current Outpatient Medications  Medication Sig Dispense Refill  . aspirin EC 81 MG tablet Take 81 mg by mouth daily.    . Calcium Carbonate (CALTRATE 600 PO) Take 1 tablet by mouth daily.    Marland Kitchen ibuprofen (ADVIL,MOTRIN) 200 MG tablet Take 200 mg by mouth every 6 (six) hours as needed.    Marland Kitchen lisinopril (PRINIVIL,ZESTRIL) 10 MG tablet Take 10 mg by mouth daily.  2  . Multiple Vitamins-Minerals (PRESERVISION AREDS 2 PO) Take 2 mg by mouth 2 (two) times daily.    . pravastatin (PRAVACHOL) 10 MG tablet Take 10 mg by mouth daily.    . sertraline (ZOLOFT) 50 MG tablet Take 1 tablet (50 mg total) by mouth daily. 90 tablet 1   No current facility-administered medications for this visit.      Musculoskeletal: Strength & Muscle Tone: within normal limits Gait & Station: normal Patient leans: N/A  Psychiatric Specialty Exam: ROS  There were no vitals taken for this visit.There is no height or weight on file to calculate BMI.  General Appearance: Fairly Groomed  Eye Contact:  Good  Speech:  Clear and Coherent  Volume:  Normal  Mood:  {BHH MOOD:22306}  Affect:  {Affect (PAA):22687}  Thought Process:  Coherent and Goal Directed  Orientation:  Full (Time, Place, and Person)  Thought Content: Logical   Suicidal Thoughts:  {ST/HT (PAA):22692}  Homicidal Thoughts:  {ST/HT (PAA):22692}  Memory:  Immediate;   Good Recent;   Good Remote;   Good  Judgement:  {Judgement (PAA):22694}  Insight:  {Insight (PAA):22695}  Psychomotor Activity:  Normal  Concentration:  Concentration: Good and Attention Span: Good  Recall:  Good  Fund of  Knowledge: Good  Language: Good  Akathisia:  No  Handed:  Right  AIMS (if indicated): not done  Assets:  Communication Skills Desire for Improvement  ADL's:  Intact  Cognition: WNL  Sleep:  {BHH GOOD/FAIR/POOR:22877}   Screenings:   Assessment and Plan:  Whitney Vaughan is a 67 y.o. year old female with a history of depression, anxiety,  hypertension, varicose vein , who presents for follow up appointment for No diagnosis found.  Psychosocial stressors including death of her sister in 30-Sep-2015, who she had unresolved conflict.   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 Will continue current dose to target depression. Discussed behavioral activation and sleep hygiene. Also discussed that she is recommended to continue medication 9-12 months to avoid any relapse.   Plan 1. Continue sertraline 50 mg daily 2. Return to clinic in four months  The patient demonstrates the following risk factors for suicide: Chronic risk factors for suicide include: psychiatric disorder of depression. Acute risk factorsfor suicide include: family or marital conflict and loss (financial, interpersonal, professional). Protective factorsfor this patient include: coping skills and hope for the future. Considering these factors, the overall suicide risk at this point appears to be low. Patient isappropriate for outpatient follow up.    Norman Clay, MD 02/20/2017, 2:45 PM

## 2017-02-26 ENCOUNTER — Ambulatory Visit (HOSPITAL_COMMUNITY): Payer: BLUE CROSS/BLUE SHIELD | Admitting: Psychiatry

## 2017-03-02 NOTE — Progress Notes (Signed)
[facility] MD/PA/NP OP Progress Note  03/06/2017 3:29 PM Whitney Vaughan  MRN:  [id number]  Chief Complaint:  Chief Complaint    Depression; Follow-up     HPI:  Patient presents for follow-up appointment for depression.  She states that she has been doing well.  She went to watch movies and eat at the restaurant by herself.  She just met with her cousin and enjoyed lunch together today.  She states that she will be feeling good as long as she does not have contact from her sister's daughter.  She feels sad about her deceased sister, who suddenly left the house. She does not want to be friendly with her sister's daughter as she does not know this person and she might say negative things about her sister. Although she is "secretive" person, she is interested in seeing a therapist again. She feels sad at times, stating that she has joint pain. She will also have eye exam soon. She feels sad that she is not able to do things as she used to.  She denies insomnia.  She denies crying spells.  She has increased appetite especially around the holiday.  She has good concentration.  She denies SI.  She denies significant anxiety.  She denies panic attacks.  She denies SI. She hopes to do regular walk again when the weather gets nicer.  Wt Readings from Last 3 Encounters:  03/06/17 183 lb (83 kg)  09/22/16 177 lb 6.4 oz (80.5 kg)  06/23/16 174 lb 3.2 oz (79 kg)     Visit Diagnosis:    ICD-10-CM   1. Major depressive disorder with single episode, in full remission ([facility]) F32.5     Past Psychiatric History:  I have reviewed the patient's psychiatry history in detail and updated the patient record. Outpatient: denies Psychiatry admission: denies Previous suicide attempt: denies Past trials of medication: Effexor History of violence: denies  Past Medical History:  Past Medical History:  Diagnosis Date  . Dog bite of calf   . Hyperlipidemia   . Varicose veins    No past surgical history on file.  Family  Psychiatric History: I have reviewed the patient's family history in detail and updated the patient record.  Family History:  Family History  Problem Relation Age of Onset  . Deep vein thrombosis Father   . Heart disease Father   . Hyperlipidemia Father   . Varicose Veins Father   . Heart attack Father   . Heart disease Mother   . Hypertension Mother   . Heart attack Mother   . Heart disease Sister        before age 36  . Hypertension Sister   . Heart attack Sister     Social History:  Social History   Socioeconomic History  . Marital status: Single    Spouse name: None  . Number of children: None  . Years of education: None  . Highest education level: None  Social Needs  . Financial resource strain: None  . Food insecurity - worry: None  . Food insecurity - inability: None  . Transportation needs - medical: None  . Transportation needs - non-medical: None  Occupational History  . None  Tobacco Use  . Smoking status: Never Smoker  . Smokeless tobacco: Never Used  Substance and Sexual Activity  . Alcohol use: No    Comment: 03-28-2016 per pt no   . Drug use: No    Comment: 03-28-2016 per pt no   . Sexual  activity: None  Other Topics Concern  . None  Social History Narrative  . None    Allergies: No Known Allergies  Metabolic Disorder Labs: No results found for: HGBA1C, MPG No results found for: PROLACTIN No results found for: CHOL, TRIG, HDL, CHOLHDL, VLDL, LDLCALC No results found for: TSH  Therapeutic Level Labs: No results found for: LITHIUM No results found for: VALPROATE No components found for:  CBMZ  Current Medications: Current Outpatient Medications  Medication Sig Dispense Refill  . aspirin EC 81 MG tablet Take 81 mg by mouth daily.    . Calcium Carbonate (CALTRATE 600 PO) Take 1 tablet by mouth daily.    Marland Kitchen ibuprofen (ADVIL,MOTRIN) 200 MG tablet Take 200 mg by mouth every 6 (six) hours as needed.    Marland Kitchen lisinopril (PRINIVIL,ZESTRIL) 10 MG  tablet Take 10 mg by mouth daily.  2  . Multiple Vitamins-Minerals (PRESERVISION AREDS 2 PO) Take 2 mg by mouth 2 (two) times daily.    . pravastatin (PRAVACHOL) 10 MG tablet Take 10 mg by mouth daily.    . sertraline (ZOLOFT) 25 MG tablet Take 1 tablet (25 mg total) by mouth daily. 90 tablet 0   No current facility-administered medications for this visit.      Musculoskeletal: Strength & Muscle Tone: within normal limits Gait & Station: normal Patient leans: N/A  Psychiatric Specialty Exam: Review of Systems  Psychiatric/Behavioral: Negative for depression, hallucinations, memory loss, substance abuse and suicidal ideas. The patient is not nervous/anxious and does not have insomnia.   All other systems reviewed and are negative.   Blood pressure (!) 154/79, pulse 71, height '5\' 6"'$  (1.676 m), weight 183 lb (83 kg), SpO2 98 %.Body mass index is 29.54 kg/m.  General Appearance: Fairly Groomed  Eye Contact:  Good  Speech:  Clear and Coherent  Volume:  Normal  Mood:  "fine"  Affect:  Appropriate, Congruent, Tearful and reactive  Thought Process:  Coherent and Goal Directed  Orientation:  Full (Time, Place, and Person)  Thought Content: Logical   Suicidal Thoughts:  No  Homicidal Thoughts:  No  Memory:  Immediate;   Good Recent;   Good Remote;   Good  Judgement:  Good  Insight:  Fair  Psychomotor Activity:  Normal  Concentration:  Concentration: Good and Attention Span: Good  Recall:  Good  Fund of Knowledge: Good  Language: Good  Akathisia:  No  Handed:  Right  AIMS (if indicated): not done  Assets:  Communication Skills Desire for Improvement  ADL's:  Intact  Cognition: WNL  Sleep:  Good   Screenings:   Assessment and Plan:  Whitney Vaughan is a 67 y.o. year old female with a history of depression, anxiety,  hypertension, varicose vein, who presents for follow up appointment for Major depressive disorder with single episode, in full remission [facility]) Psychosocial  stressors including death of her sister in 09-01-2015, who she had unresolved conflict.   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 She has been on sertraline since Feb 2018; will taper down sertraline to target depression with the hope to be off in the future. Provided psycho education of relapse prevention.  Discussed behavioral activation. Validated her demoralization secondary to physical issues. Explored her feeling of sadness around her deceased sister, who she had conflict with. She is advised to re-connect with her therapist to continue to explore these issues.   # Hypertension She is advised to contact  her PCP.  Plan I have reviewed and updated plans as below 1. Decrease sertraline 25 mg daily  2. Return to clinic in three months for 15 mins - She may contact Dr. Raynald Kemp for therapy  The patient demonstrates the following risk factors for suicide: Chronic risk factors for suicide include: psychiatric disorder of depression. Acute risk factorsfor suicide include: family or marital conflict and loss (financial, interpersonal, professional). Protective factorsfor this patient include: coping skills and hope for the future. Considering these factors, the overall suicide risk at this point appears to be low. Patient isappropriate for outpatient follow up.  The duration of this appointment visit was 30 minutes of face-to-face time with the patient.  Greater than 50% of this time was spent in counseling, explanation of  diagnosis, planning of further management, and coordination of care.  Norman Clay, MD 03/06/2017, 3:29 PM

## 2017-03-06 ENCOUNTER — Encounter (HOSPITAL_COMMUNITY): Payer: Self-pay | Admitting: Psychiatry

## 2017-03-06 ENCOUNTER — Ambulatory Visit (INDEPENDENT_AMBULATORY_CARE_PROVIDER_SITE_OTHER): Payer: BLUE CROSS/BLUE SHIELD | Admitting: Psychiatry

## 2017-03-06 VITALS — BP 154/79 | HR 71 | Ht 66.0 in | Wt 183.0 lb

## 2017-03-06 DIAGNOSIS — Z634 Disappearance and death of family member: Secondary | ICD-10-CM

## 2017-03-06 DIAGNOSIS — I10 Essential (primary) hypertension: Secondary | ICD-10-CM

## 2017-03-06 DIAGNOSIS — F325 Major depressive disorder, single episode, in full remission: Secondary | ICD-10-CM

## 2017-03-06 MED ORDER — SERTRALINE HCL 25 MG PO TABS: 25.0000 mg | ORAL_TABLET | Freq: Every day | ORAL | 0 refills | Status: DC

## 2017-03-06 NOTE — Patient Instructions (Signed)
1. Decrease sertraline 25 mg daily (contact the clinic if you have any worsening in depression after decreasing the dose) 2. Return to clinic in three months for 15 mins

## 2017-06-03 NOTE — Progress Notes (Signed)
BH MD/PA/NP OP Progress Note  06/04/2017 1:19 PM Whitney Vaughan  MRN:  [id number]  Chief Complaint:  Chief Complaint    Depression; Follow-up     HPI:  Patient presents for follow-up appointment for depression.  She states that she has not noticed any change after decreasing sertraline. She even felt "better" as she believes she is accepting the past. She tries not to look about the past and will not take any phone call from her nephew if she gets any. She wants to put it in the "box." She later becomes tearful, stating that she tries not to think about the past. She enjoys gardening and has started to do some paperwork as she is unable to do crochet anymore. She also wants to make some quilt. She has a moment of feeling depressed for two hours or so, although she would feel better afterwards. She was at home on Easter with her cat as she felt anxious of joining dinner at her friends' house. She has fair sleep. She has good concentration. She has good appetite. She denies SI. She denies anxiety or panic attacks.    Visit Diagnosis:    ICD-10-CM   1. Major depressive disorder with single episode, in full remission ([place]) F32.5     Past Psychiatric History:  I have reviewed the patient's psychiatry history in detail and updated the patient record. Outpatient: denies Psychiatry admission: denies Previous suicide attempt: denies Past trials of medication: Effexor History of violence: denies    Past Medical History:  Past Medical History:  Diagnosis Date  . Dog bite of calf   . Hyperlipidemia   . Varicose veins    No past surgical history on file.  Family Psychiatric History: I have reviewed the patient's family history in detail and updated the patient record.  Family History:  Family History  Problem Relation Age of Onset  . Deep vein thrombosis Father   . Heart disease Father   . Hyperlipidemia Father   . Varicose Veins Father   . Heart attack Father   . Heart disease  Mother   . Hypertension Mother   . Heart attack Mother   . Heart disease Sister        before age 55  . Hypertension Sister   . Heart attack Sister     Social History:  Social History   Socioeconomic History  . Marital status: Single    Spouse name: Not on file  . Number of children: Not on file  . Years of education: Not on file  . Highest education level: Not on file  Occupational History  . Not on file  Social Needs  . Financial resource strain: Not on file  . Food insecurity:    Worry: Not on file    Inability: Not on file  . Transportation needs:    Medical: Not on file    Non-medical: Not on file  Tobacco Use  . Smoking status: Never Smoker  . Smokeless tobacco: Never Used  Substance and Sexual Activity  . Alcohol use: No    Comment: 03-28-2016 per pt no   . Drug use: No    Comment: 03-28-2016 per pt no   . Sexual activity: Not on file  Lifestyle  . Physical activity:    Days per week: Not on file    Minutes per session: Not on file  . Stress: Not on file  Relationships  . Social connections:    Talks on phone: Not on file  Gets together: Not on file    Attends religious service: Not on file    Active member of club or organization: Not on file    Attends meetings of clubs or organizations: Not on file    Relationship status: Not on file  Other Topics Concern  . Not on file  Social History Narrative  . Not on file    Allergies: No Known Allergies  Metabolic Disorder Labs: No results found for: HGBA1C, MPG No results found for: PROLACTIN No results found for: CHOL, TRIG, HDL, CHOLHDL, VLDL, LDLCALC No results found for: TSH  Therapeutic Level Labs: No results found for: LITHIUM No results found for: VALPROATE No components found for:  CBMZ  Current Medications: Current Outpatient Medications  Medication Sig Dispense Refill  . aspirin EC 81 MG tablet Take 81 mg by mouth daily.    . Calcium Carbonate (CALTRATE 600 PO) Take 1 tablet by mouth  daily.    Marland Kitchen ibuprofen (ADVIL,MOTRIN) 200 MG tablet Take 200 mg by mouth every 6 (six) hours as needed.    Marland Kitchen lisinopril (PRINIVIL,ZESTRIL) 10 MG tablet Take 10 mg by mouth daily.  2  . Multiple Vitamins-Minerals (PRESERVISION AREDS 2 PO) Take 2 mg by mouth 2 (two) times daily.    . pravastatin (PRAVACHOL) 10 MG tablet Take 10 mg by mouth daily.    . sertraline (ZOLOFT) 25 MG tablet Take 1 tablet (25 mg total) by mouth daily. 90 tablet 0   No current facility-administered medications for this visit.      Musculoskeletal: Strength & Muscle Tone: within normal limits Gait & Station: normal Patient leans: N/A  Psychiatric Specialty Exam: ROS 10 point ROS negative. No headache, nausea, dizziness, palpitation, rash. Denies AH, VH, SI, HI.   Blood pressure (!) 155/82, pulse 67, height 5\' 6"  (1.676 m), weight 186 lb (84.4 kg), SpO2 98 %.Body mass index is 30.02 kg/m.  General Appearance: Fairly Groomed  Eye Contact:  Good  Speech:  Clear and Coherent  Volume:  Normal  Mood:  "fine"  Affect:  Appropriate, Congruent and tearful at times, after talking about her sister  Thought Process:  Coherent and Goal Directed  Orientation:  Full (Time, Place, and Person)  Thought Content: Logical   Suicidal Thoughts:  No  Homicidal Thoughts:  No  Memory:  Immediate;   Good  Judgement:  Good  Insight:  Fair  Psychomotor Activity:  Normal  Concentration:  Concentration: Good and Attention Span: Good  Recall:  Good  Fund of Knowledge: Good  Language: Good  Akathisia:  No  Handed:  Right  AIMS (if indicated): not done  Assets:  Communication Skills Desire for Improvement  ADL's:  Intact  Cognition: WNL  Sleep:  Fair   Screenings:   Assessment and Plan:  Whitney Vaughan is a 67 y.o. year old female with a history of depression, anxiety, hypertension, varicose vein , who presents for follow up appointment for Major depressive disorder with single episode, in full remission ([place]). Psychosocial  stressors including death of her sister in July 2017,who she had unresolved conflict.  # MDD, single episode, mild without psychotic features Patient denies significant mood symptoms since the last appointment.  Will discontinue sertraline given this was her first episode of depression (she was on this since Feb2018). Provided psycho-education of relapse. She is advised to restart medication if any worsening in mood symptoms and contact the office if no improvement. Validated her feeling of sadness around her deceased sister.   #  Hypertension She is followed by PCP.   Plan 1. Discontinue sertraline.  Restart sertraline 25 mg daily if any worsening in your mood symptoms.  2. Return to clinic in three months for 15 mins - She may contact Dr. Raynald Kemp for therapy  The patient demonstrates the following risk factors for suicide: Chronic risk factors for suicide include: psychiatric disorder of depression. Acute risk factorsfor suicide include: family or marital conflict and loss (financial, interpersonal, professional). Protective factorsfor this patient include: coping skills and hope for the future. Considering these factors, the overall suicide risk at this point appears to be low. Patient isappropriate for outpatient follow up.  Norman Clay, MD 06/04/2017, 1:19 PM

## 2017-06-04 ENCOUNTER — Ambulatory Visit (INDEPENDENT_AMBULATORY_CARE_PROVIDER_SITE_OTHER): Payer: BLUE CROSS/BLUE SHIELD | Admitting: Psychiatry

## 2017-06-04 ENCOUNTER — Encounter (HOSPITAL_COMMUNITY): Payer: Self-pay | Admitting: Psychiatry

## 2017-06-04 VITALS — BP 155/82 | HR 67 | Ht 66.0 in | Wt 186.0 lb

## 2017-06-04 DIAGNOSIS — I8390 Asymptomatic varicose veins of unspecified lower extremity: Secondary | ICD-10-CM | POA: Diagnosis not present

## 2017-06-04 DIAGNOSIS — F325 Major depressive disorder, single episode, in full remission: Secondary | ICD-10-CM

## 2017-06-04 DIAGNOSIS — I10 Essential (primary) hypertension: Secondary | ICD-10-CM | POA: Diagnosis not present

## 2017-06-04 NOTE — Patient Instructions (Signed)
1. Discontinue sertraline.  Restart sertraline 25 mg daily if any worsening in your mood symptoms.  2. Return to clinic in three months for 15 mins

## 2017-08-31 NOTE — Progress Notes (Signed)
BH MD/PA/NP OP Progress Note  09/04/2017 1:17 PM Whitney Vaughan  MRN:  [id number]  Chief Complaint:  Chief Complaint    Follow-up; Depression     HPI:  The patient presents for follow-up appointment for depression.  She states that there has been no significant change in her mood since discontinuation of sertraline. She talks about her 65 year old friend friend, who she takes care of. This friend's daughter is not in the picture and the patient takes care of this friend. She believes that this friend needs to go to nursing home and she is planning to advise this to this friend. Although she feels anxious and overwhelmed about this situation, she denies any episode of depression or panic attacks. She enjoys meeting with her cousin, going to senior center and playing bingo. She sleeps well. She has good appetite. She has good energy. She denies SI.  . Wt Readings from Last 3 Encounters:  09/04/17 188 lb (85.3 kg)  06/04/17 186 lb (84.4 kg)  03/06/17 183 lb (83 kg)    Visit Diagnosis:    ICD-10-CM   1. Major depressive disorder with single episode, in full remission ([place]) F32.5     Past Psychiatric History: Please see initial evaluation for full details. I have reviewed the history. No updates at this time.     Past Medical History:  Past Medical History:  Diagnosis Date  . Dog bite of calf   . Hyperlipidemia   . Varicose veins    No past surgical history on file.  Family Psychiatric History: Please see initial evaluation for full details. I have reviewed the history. No updates at this time.     Family History:  Family History  Problem Relation Age of Onset  . Deep vein thrombosis Father   . Heart disease Father   . Hyperlipidemia Father   . Varicose Veins Father   . Heart attack Father   . Heart disease Mother   . Hypertension Mother   . Heart attack Mother   . Heart disease Sister        before age 1  . Hypertension Sister   . Heart attack Sister     Social History:   Social History   Socioeconomic History  . Marital status: Single    Spouse name: Not on file  . Number of children: Not on file  . Years of education: Not on file  . Highest education level: Not on file  Occupational History  . Not on file  Social Needs  . Financial resource strain: Not on file  . Food insecurity:    Worry: Not on file    Inability: Not on file  . Transportation needs:    Medical: Not on file    Non-medical: Not on file  Tobacco Use  . Smoking status: Never Smoker  . Smokeless tobacco: Never Used  Substance and Sexual Activity  . Alcohol use: No    Comment: 03-28-2016 per pt no   . Drug use: No    Comment: 03-28-2016 per pt no   . Sexual activity: Not on file  Lifestyle  . Physical activity:    Days per week: Not on file    Minutes per session: Not on file  . Stress: Not on file  Relationships  . Social connections:    Talks on phone: Not on file    Gets together: Not on file    Attends religious service: Not on file    Active member of  club or organization: Not on file    Attends meetings of clubs or organizations: Not on file    Relationship status: Not on file  Other Topics Concern  . Not on file  Social History Narrative  . Not on file    Allergies: No Known Allergies  Metabolic Disorder Labs: No results found for: HGBA1C, MPG No results found for: PROLACTIN No results found for: CHOL, TRIG, HDL, CHOLHDL, VLDL, LDLCALC No results found for: TSH  Therapeutic Level Labs: No results found for: LITHIUM No results found for: VALPROATE No components found for:  CBMZ  Current Medications: Current Outpatient Medications  Medication Sig Dispense Refill  . aspirin EC 81 MG tablet Take 81 mg by mouth daily.    . Calcium Carbonate (CALTRATE 600 PO) Take 1 tablet by mouth daily.    Marland Kitchen ibuprofen (ADVIL,MOTRIN) 200 MG tablet Take 200 mg by mouth every 6 (six) hours as needed.    Marland Kitchen lisinopril (PRINIVIL,ZESTRIL) 10 MG tablet Take 10 mg by mouth  daily.  2  . Multiple Vitamins-Minerals (PRESERVISION AREDS 2 PO) Take 2 mg by mouth 2 (two) times daily.    . pravastatin (PRAVACHOL) 10 MG tablet Take 10 mg by mouth daily.    . sertraline (ZOLOFT) 25 MG tablet Take 1 tablet (25 mg total) by mouth daily. 90 tablet 0   No current facility-administered medications for this visit.      Musculoskeletal: Strength & Muscle Tone: within normal limits Gait & Station: normal Patient leans: N/A  Psychiatric Specialty Exam: Review of Systems  Psychiatric/Behavioral: Negative for depression, hallucinations, memory loss, substance abuse and suicidal ideas. The patient is nervous/anxious. The patient does not have insomnia.   All other systems reviewed and are negative.   Blood pressure 133/78, pulse 81, height 5\' 6"  (1.676 m), weight 188 lb (85.3 kg), SpO2 98 %.Body mass index is 30.34 kg/m.  General Appearance: Fairly Groomed  Eye Contact:  Good  Speech:  Clear and Coherent  Volume:  Normal  Mood:  "good"  Affect:  Appropriate, Congruent, Tearful and euthymic  Thought Process:  Coherent  Orientation:  Full (Time, Place, and Person)  Thought Content: Logical   Suicidal Thoughts:  No  Homicidal Thoughts:  No  Memory:  Immediate;   Good  Judgement:  Good  Insight:  Good  Psychomotor Activity:  Normal  Concentration:  Concentration: Good and Attention Span: Good  Recall:  Good  Fund of Knowledge: Good  Language: Good  Akathisia:  No  Handed:  Right  AIMS (if indicated): not done  Assets:  Communication Skills Desire for Improvement  ADL's:  Intact  Cognition: WNL  Sleep:  Good   Screenings:   Assessment and Plan:  Whitney Vaughan is a 67 y.o. year old female with a history of depression, anxiety, hypertension, varicose vein , who presents for follow up appointment for Major depressive disorder with single episode, in full remission ([place]) . Psychosocial stressors including death of her sister in July 2017,who she had unresolved  conflict.  # MDD, single episode, mild without psychotic features Although the patient has been reasonably stressed in the context of taking care of her friend, she denies significant mood episode since the last visit. She was on sertraline more than one year, and successfully tapered off this medication. Provided psycho education of relapse. She is advised to be followed by PCP and return to clinic as needed.   Plan 1. Return to clinic as needed  (completed treatment with sertraline)  The patient demonstrates the following risk factors for suicide: Chronic risk factors for suicide include: psychiatric disorder of depression. Acute risk factorsfor suicide include: family or marital conflict and loss (financial, interpersonal, professional). Protective factorsfor this patient include: coping skills and hope for the future. Considering these factors, the overall suicide risk at this point appears to be low. Patient isappropriate for outpatient follow up.    Norman Clay, MD 09/04/2017, 1:17 PM

## 2017-09-03 ENCOUNTER — Ambulatory Visit (HOSPITAL_COMMUNITY): Payer: Self-pay | Admitting: Psychiatry

## 2017-09-04 ENCOUNTER — Encounter (HOSPITAL_COMMUNITY): Payer: Self-pay | Admitting: Psychiatry

## 2017-09-04 ENCOUNTER — Ambulatory Visit (INDEPENDENT_AMBULATORY_CARE_PROVIDER_SITE_OTHER): Payer: BLUE CROSS/BLUE SHIELD | Admitting: Psychiatry

## 2017-09-04 VITALS — BP 133/78 | HR 81 | Ht 66.0 in | Wt 188.0 lb

## 2017-09-04 DIAGNOSIS — F325 Major depressive disorder, single episode, in full remission: Secondary | ICD-10-CM | POA: Diagnosis not present

## 2017-09-04 NOTE — Patient Instructions (Signed)
1. Return to clinic as needed

## 2017-11-07 HISTORY — PX: ENDOMETRIAL BIOPSY: SHX622

## 2017-12-11 ENCOUNTER — Telehealth: Payer: Self-pay

## 2017-12-11 NOTE — Telephone Encounter (Signed)
Incoming call from [facility] office "Roselyn Reef" nurse reports physician needs to refer pt here.  Our fax number provided for her to fax referral and let her know that our provider would review and then we would call them with appt.  Physician there prefers to know appt before pt's next office visit next week so he can provide her with appt infor for here.

## 2017-12-12 ENCOUNTER — Telehealth: Payer: Self-pay | Admitting: *Deleted

## 2017-12-12 NOTE — Telephone Encounter (Signed)
Called and spoke with Roselyn Reef at [facility] care at [place]. Holding a new patient appt for the patient on 11/8 at [place] to call back on Monday after they see the patient to schedule appt.

## 2017-12-21 ENCOUNTER — Telehealth: Payer: Self-pay | Admitting: Oncology

## 2017-12-21 ENCOUNTER — Inpatient Hospital Stay: Payer: BLUE CROSS/BLUE SHIELD | Attending: Obstetrics | Admitting: Obstetrics

## 2017-12-21 ENCOUNTER — Ambulatory Visit: Payer: Self-pay | Admitting: Obstetrics

## 2017-12-21 ENCOUNTER — Encounter: Payer: Self-pay | Admitting: Obstetrics

## 2017-12-21 VITALS — BP 157/53 | HR 58 | Temp 98.4°F | Ht 66.0 in | Wt 179.5 lb

## 2017-12-21 DIAGNOSIS — C541 Malignant neoplasm of endometrium: Secondary | ICD-10-CM | POA: Diagnosis present

## 2017-12-21 DIAGNOSIS — Z90722 Acquired absence of ovaries, bilateral: Secondary | ICD-10-CM | POA: Insufficient documentation

## 2017-12-21 DIAGNOSIS — Z90710 Acquired absence of both cervix and uterus: Secondary | ICD-10-CM | POA: Insufficient documentation

## 2017-12-21 NOTE — H&P (View-Only) (Signed)
[place] [facility] Note: New Patient FIRST VISIT   Consult was requested by Dr. Nat Math for endometrial cancer   Chief Complaint  Patient presents with  . Endometrial carcinoma [facility])    GYN Oncologic Summary 1. TBD o   HPI: Ms. Whitney Vaughan  is a very nice 67 y.o.  P0  She has been taking care of a friend who has been in poor health.  At the same time she has noticed post menopausal bleeding.  She described this is mostly spotting with occasional cyclic bleeding like a.  This was ongoing for approximately 1 year.  After her obligation to the ill friend she followed up with her primary care provider about the bleeding.  She was promptly referred on to GYN, Dr. Adah Perl.  A transvaginal ultrasound was performed revealing a uterus 6.9 x 3.3 x 4.3 cm this was performed at [facility] rocking him.  The endometrial stripe is noted to be 18 cm?  I suspect a typo.  An endometrial biopsy was obtained revealing atypical endometrium.  The patient was then scheduled for hysteroscopic [facility] 12/06/2017.  This revealed grade 1 endometrioid adenocarcinoma.  The patient was therefore referred for management recommendations.  Other than the 1 year of postmenopausal bleeding she has no complaints.  Imported EPIC Oncologic History:   No history exists.    Measurement of disease: TBD . Marland Kitchen  Radiology: No results found.  [place] preop as noted above  Outpatient Encounter Medications as of 12/21/2017  Medication Sig  . aspirin EC 81 MG tablet Take 81 mg by mouth daily.  . Calcium Carbonate (CALTRATE 600 PO) Take 1 tablet by mouth daily.  Marland Kitchen ibuprofen (ADVIL,MOTRIN) 200 MG tablet Take 200 mg by mouth every 6 (six) hours as needed.  Marland Kitchen lisinopril (PRINIVIL,ZESTRIL) 10 MG tablet Take 10 mg by mouth daily.  . Multiple Vitamins-Minerals (PRESERVISION AREDS 2 PO) Take 2 mg by mouth 2 (two) times daily.  . pravastatin (PRAVACHOL) 10 MG tablet Take 10 mg by  mouth daily.  . sertraline (ZOLOFT) 25 MG tablet Take 1 tablet (25 mg total) by mouth daily.   No facility-administered encounter medications on file as of 12/21/2017.    No Known Allergies  Past Medical History:  Diagnosis Date  . Dog bite of calf   . Hyperlipidemia   . Varicose veins    History reviewed. No pertinent surgical history.      Past Gynecological History:   GYNECOLOGIC HISTORY:  . No LMP recorded. Patient is postmenopausal. age 63 . Menarche: 67 years old . P 0 . Contraceptive OCP prior . HRT none  . Last Pap 10/2017 negative Family Hx:  Family History  Problem Relation Age of Onset  . Deep vein thrombosis Father   . Heart disease Father   . Hyperlipidemia Father   . Varicose Veins Father   . Heart attack Father   . Heart disease Mother   . Hypertension Mother   . Heart attack Mother   . Heart disease Sister        before age 83  . Hypertension Sister   . Heart attack Sister    Social Hx:  Marland Kitchen Tobacco use: none . Alcohol use: none . Illicit Drug use: none . Illicit IV Drug use: none    Review of Systems: Review of Systems  Constitutional: Negative.   HENT:  Negative.   Eyes: Negative.   Respiratory: Negative.   Cardiovascular: Negative.  Gastrointestinal: Negative.   Endocrine: Negative.   Genitourinary: Negative.    Musculoskeletal: Negative.   Skin: Negative.   Neurological: Negative.   Hematological: Negative.   Psychiatric/Behavioral: Negative.     Vitals:  Vitals:   12/21/17 0838  BP: (!) 157/53  Pulse: (!) 58  Temp: 98.4 F (36.9 C)  SpO2: 99%   Vitals:   12/21/17 0838  Weight: 179 lb 8 oz (81.4 kg)  Height: 5\' 6"  (1.676 m)   Body mass index is 28.97 kg/m.  Physical Exam: General :  Well developed, 67 y.o., female in no apparent distress HEENT:  Normocephalic/atraumatic, symmetric, EOMI, eyelids normal Neck:   Supple, no masses.  Lymphatics:  No cervical/ submandibular/ supraclavicular/ infraclavicular/ inguinal  adenopathy Respiratory:  Respirations unlabored, no use of accessory muscles CV:   Deferred Breast:  Deferred Musculoskeletal: No CVA tenderness, normal muscle strength. Abdomen:  Soft, non-tender and nondistended. No evidence of hernia. No masses. Extremities:  No lymphedema, no erythema, non-tender. Skin:   Normal inspection Neuro/Psych:  No focal motor deficit, no abnormal mental status. Normal gait. Normal affect. Alert and oriented to person, place, and time  Genito Urinary: Vulva: Normal external female genitalia.  Bladder/urethra: Urethral meatus normal in size and location. No lesions or   masses, well supported bladder Speculum exam: Vagina: No lesion, no discharge, minimal blood in vault  Cervix: Normal appearing, no lesions. Bimanual exam:  Uterus: Difficult to delineate size. Patient guarding  Adnexal region: No masses. Rectovaginal:  Good tone, no masses, no cul de sac nodularity, no parametrial involvement or nodularity.   Assessment  Uterine CA ECOG PERFORMANCE STATUS: 1 - Symptomatic but completely ambulatory  Plan   1. Counseling for uterine cancer ? We discussed the diagnosis of uterine cancer the typical presentation and prognosis ? We reviewed the difference between grade and stage ? Standard of care hysterectomy versus alternatives of hormones and radiation were reviewed ? We discussed the possibility of additional treatments following surgery, such as radiation and/or chemotherapy 2. Surgical management ? Patient would like to proceed with standard of care management i.e. Hysterectomy ? Surgical sketch was reviewed including the risks, benefits, and alternatives.  She was given a copy of this today and one will be placed in the chart. ? Plan will be for robotic assisted laparoscopic hysterectomy, BSO, sentinel lymph node biopsy, washings, and possible full staging 3. Preoperative items will include  ? Obtaining a chest x-ray 4. The patient and her cousin  were present for the discussion with no further questions after review of the above 5. I will plan to have her return in 10 to 14 days postop to review the pathology and discuss any further management 6. NOTE the [place] states her lining is 18cm. I suspect that is mm. We have a call into [facility] to be sure. Her D&C op report states the uterus was sounded to 6cm, so I suspect robotic is feasible    Face to face time with patient was 60 minutes. Over 50% of this time was spent on counseling and coordination of care.   Mart Piggs, MD Gynecologic Oncologist 12/21/2017, 9:00 AM    Cc: Nat Math, MD (Referring Ob/Gyn) Monico Blitz, MD (PCP)

## 2017-12-21 NOTE — Progress Notes (Addendum)
Consult Note: New Patient FIRST VISIT   Consult was requested by Dr. Nat Math for endometrial cancer   Chief Complaint  Patient presents with  . Endometrial carcinoma [facility])    GYN Oncologic Summary 1. TBD o   HPI: Ms. Whitney Vaughan  is a very nice 67 y.o.  P0  She has been taking care of a friend who has been in poor health.  At the same time she has noticed post menopausal bleeding.  She described this is mostly spotting with occasional cyclic bleeding like a.  This was ongoing for approximately 1 year.  After her obligation to the ill friend she followed up with her primary care provider about the bleeding.  She was promptly referred on to GYN, Dr. Adah Perl.  A transvaginal ultrasound was performed revealing a uterus 6.9 x 3.3 x 4.3 cm this was performed at [facility] rocking him.  The endometrial stripe is noted to be 18 cm?  I suspect a typo.  An endometrial biopsy was obtained revealing atypical endometrium.  The patient was then scheduled for hysteroscopic [facility] 12/06/2017.  This revealed grade 1 endometrioid adenocarcinoma.  The patient was therefore referred for management recommendations.  Other than the 1 year of postmenopausal bleeding she has no complaints.  Imported EPIC Oncologic History:   No history exists.    Measurement of disease: TBD . Marland Kitchen  Radiology: No results found.  [place] preop as noted above  Outpatient Encounter Medications as of 12/21/2017  Medication Sig  . aspirin EC 81 MG tablet Take 81 mg by mouth daily.  . Calcium Carbonate (CALTRATE 600 PO) Take 1 tablet by mouth daily.  Marland Kitchen ibuprofen (ADVIL,MOTRIN) 200 MG tablet Take 200 mg by mouth every 6 (six) hours as needed.  Marland Kitchen lisinopril (PRINIVIL,ZESTRIL) 10 MG tablet Take 10 mg by mouth daily.  . Multiple Vitamins-Minerals (PRESERVISION AREDS 2 PO) Take 2 mg by mouth 2 (two) times daily.  . pravastatin (PRAVACHOL) 10 MG tablet Take 10 mg by mouth daily.  . sertraline (ZOLOFT) 25 MG tablet Take 1 tablet (25  mg total) by mouth daily.   No facility-administered encounter medications on file as of 12/21/2017.    No Known Allergies  Past Medical History:  Diagnosis Date  . Dog bite of calf   . Hyperlipidemia   . Varicose veins    History reviewed. No pertinent surgical history.      Past Gynecological History:   GYNECOLOGIC HISTORY:  . No LMP recorded. Patient is postmenopausal. age 21 . Menarche: 67 years old . P 0 . Contraceptive OCP prior . HRT none  . Last Pap 10/2017 negative Family Hx:  Family History  Problem Relation Age of Onset  . Deep vein thrombosis Father   . Heart disease Father   . Hyperlipidemia Father   . Varicose Veins Father   . Heart attack Father   . Heart disease Mother   . Hypertension Mother   . Heart attack Mother   . Heart disease Sister        before age 85  . Hypertension Sister   . Heart attack Sister    Social Hx:  Marland Kitchen Tobacco use: none . Alcohol use: none . Illicit Drug use: none . Illicit IV Drug use: none    Review of Systems: Review of Systems  Constitutional: Negative.   HENT:  Negative.   Eyes: Negative.   Respiratory: Negative.   Cardiovascular: Negative.   Gastrointestinal: Negative.   Endocrine: Negative.   Genitourinary: Negative.  Musculoskeletal: Negative.   Skin: Negative.   Neurological: Negative.   Hematological: Negative.   Psychiatric/Behavioral: Negative.     Vitals:  Vitals:   12/21/17 0838  BP: (!) 157/53  Pulse: (!) 58  Temp: 98.4 F (36.9 C)  SpO2: 99%   Vitals:   12/21/17 0838  Weight: 179 lb 8 oz (81.4 kg)  Height: 5\' 6"  (1.676 m)   Body mass index is 28.97 kg/m.  Physical Exam: General :  Well developed, 68 y.o., female in no apparent distress HEENT:  Normocephalic/atraumatic, symmetric, EOMI, eyelids normal Neck:   Supple, no masses.  Lymphatics:  No cervical/ submandibular/ supraclavicular/ infraclavicular/ inguinal adenopathy Respiratory:  Respirations unlabored, no use of accessory  muscles CV:   Deferred Breast:  Deferred Musculoskeletal: No CVA tenderness, normal muscle strength. Abdomen:  Soft, non-tender and nondistended. No evidence of hernia. No masses. Extremities:  No lymphedema, no erythema, non-tender. Skin:   Normal inspection Neuro/Psych:  No focal motor deficit, no abnormal mental status. Normal gait. Normal affect. Alert and oriented to person, place, and time  Genito Urinary: Vulva: Normal external female genitalia.  Bladder/urethra: Urethral meatus normal in size and location. No lesions or   masses, well supported bladder Speculum exam: Vagina: No lesion, no discharge, minimal blood in vault  Cervix: Normal appearing, no lesions. Bimanual exam:  Uterus: Difficult to delineate size. Patient guarding  Adnexal region: No masses. Rectovaginal:  Good tone, no masses, no cul de sac nodularity, no parametrial involvement or nodularity.   Assessment  Uterine CA ECOG PERFORMANCE STATUS: 1 - Symptomatic but completely ambulatory  Plan   1. Counseling for uterine cancer ? We discussed the diagnosis of uterine cancer the typical presentation and prognosis ? We reviewed the difference between grade and stage ? Standard of care hysterectomy versus alternatives of hormones and radiation were reviewed ? We discussed the possibility of additional treatments following surgery, such as radiation and/or chemotherapy 2. Surgical management ? Patient would like to proceed with standard of care management i.e. Hysterectomy ? Surgical sketch was reviewed including the risks, benefits, and alternatives.  She was given a copy of this today and one will be placed in the chart. ? Plan will be for robotic assisted laparoscopic hysterectomy, BSO, sentinel lymph node biopsy, washings, and possible full staging 3. Preoperative items will include  ? Obtaining a chest x-ray 4. The patient and her cousin were present for the discussion with no further questions after review  of the above 5. I will plan to have her return in 10 to 14 days postop to review the pathology and discuss any further management 6. NOTE the [place] states her lining is 18cm. I suspect that is mm. We have a call into [facility] to be sure. Her D&C op report states the uterus was sounded to 6cm, so I suspect robotic is feasible    Face to face time with patient was 60 minutes. Over 50% of this time was spent on counseling and coordination of care.   Mart Piggs, MD Gynecologic Oncologist 12/21/2017, 9:00 AM    Cc: Nat Math, MD (Referring Ob/Gyn) Monico Blitz, MD (PCP)

## 2017-12-21 NOTE — Patient Instructions (Signed)
Preparing for your Surgery  Plan for surgery on December 27, 2017 with Dr. Precious Haws at [place] will be scheduled for a robotic assisted total hysterectomy, bilateral salpingo-oophorectomy, sentinel lymph node biopsy, possible staging and any indicated procedures.   Pre-operative Testing -You will receive a phone call from presurgical testing at [facility] to arrange for a pre-operative testing appointment before your surgery.  This appointment normally occurs one to two weeks before your scheduled surgery.   -Bring your insurance card, copy of an advanced directive if applicable, medication list  -At that visit, you will be asked to sign a consent for a possible blood transfusion in case a transfusion becomes necessary during surgery.  The need for a blood transfusion is rare but having consent is a necessary part of your care.     -You should not be taking blood thinners or aspirin at least ten days prior to surgery unless instructed by your surgeon.  Day Before Surgery at [place] will be asked to take in a light diet the day before surgery.  Avoid carbonated beverages.  You will be advised to have nothing to eat or drink after midnight the evening before.    Eat a light diet the day before surgery.  Examples including soups, broths, toast, yogurt, mashed potatoes.  Things to avoid include carbonated beverages (fizzy beverages), raw fruits and raw vegetables, or beans.   If your bowels are filled with gas, your surgeon will have difficulty visualizing your pelvic organs which increases your surgical risks.  Your role in recovery Your role is to become active as soon as directed by your doctor, while still giving yourself time to heal.  Rest when you feel tired. You will be asked to do the following in order to speed your recovery:  - Cough and breathe deeply. This helps toclear and expand your lungs and can prevent pneumonia. You may be given a  spirometer to practice deep breathing. A staff member will show you how to use the spirometer. - Do mild physical activity. Walking or moving your legs help your circulation and body functions return to normal. A staff member will help you when you try to walk and will provide you with simple exercises. Do not try to get up or walk alone the first time. - Actively manage your pain. Managing your pain lets you move in comfort. We will ask you to rate your pain on a scale of zero to 10. It is your responsibility to tell your doctor or nurse where and how much you hurt so your pain can be treated.  Special Considerations -If you are diabetic, you may be placed on insulin after surgery to have closer control over your blood sugars to promote healing and recovery.  This does not mean that you will be discharged on insulin.  If applicable, your oral antidiabetics will be resumed when you are tolerating a solid diet.  -Your final pathology results from surgery should be available around one week after surgery and the results will be relayed to you when available.  -Dr. Lahoma Crocker is the Surgeon that assists your GYN Oncologist with surgery.  The next day after your surgery you will either see your GYN Oncologist, Dr. Everitt Amber, or Dr. Lahoma Crocker.  -FMLA forms can be faxed to [phone] and please allow 5-7 business days for completion.   Blood Transfusion Information WHAT IS A BLOOD TRANSFUSION? A transfusion is the replacement of blood or some  of its parts. Blood is made up of multiple cells which provide different functions.  Red blood cells carry oxygen and are used for blood loss replacement.  [place] blood cells fight against infection.  Platelets control bleeding.  Plasma helps clot blood.  Other blood products are available for specialized needs, such as hemophilia or other clotting disorders. BEFORE THE TRANSFUSION  Who gives blood for transfusions?   You may be able  to donate blood to be used at a later date on yourself (autologous donation).  Relatives can be asked to donate blood. This is generally not any safer than if you have received blood from a stranger. The same precautions are taken to ensure safety when a relative's blood is donated.  Healthy volunteers who are fully evaluated to make sure their blood is safe. This is blood bank blood. Transfusion therapy is the safest it has ever been in the practice of medicine. Before blood is taken from a donor, a complete history is taken to make sure that person has no history of diseases nor engages in risky social behavior (examples are intravenous drug use or sexual activity with multiple partners). The donor's travel history is screened to minimize risk of transmitting infections, such as malaria. The donated blood is tested for signs of infectious diseases, such as HIV and hepatitis. The blood is then tested to be sure it is compatible with you in order to minimize the chance of a transfusion reaction. If you or a relative donates blood, this is often done in anticipation of surgery and is not appropriate for emergency situations. It takes many days to process the donated blood. RISKS AND COMPLICATIONS Although transfusion therapy is very safe and saves many lives, the main dangers of transfusion include:   Getting an infectious disease.  Developing a transfusion reaction. This is an allergic reaction to something in the blood you were given. Every precaution is taken to prevent this. The decision to have a blood transfusion has been considered carefully by your caregiver before blood is given. Blood is not given unless the benefits outweigh the risks.

## 2017-12-21 NOTE — Telephone Encounter (Signed)
Called [facility] about measurements for uterine thickness on US pelvis from 11/13/17.  Talked with Santiago Glad the Sonographer and she said it should read 18 mm instead of 18 cm.  She will fax over an addendum.

## 2017-12-24 NOTE — Patient Instructions (Addendum)
Whitney Vaughan  12/24/2017   Your procedure is scheduled on: 12/27/2017   Report to [facility] Main  Entrance  Report to admitting at    1100 AM    Call this number if you have problems the morning of surgery [phone]  Eat a light diet the day before surgery.  Examples include : soups, broths, toast, yogurt and mashed potatoes.  Things to avoid include carbonated beverages, raw fruits and vegetables and beans.    Remember: Do not eat food  :After Midnight. BRUSH YOUR TEETH MORNING OF SURGERY AND RINSE YOUR MOUTH OUT, NO CHEWING GUM CANDY OR MINTS.  NO SOLID FOOD AFTER MIDNIGHT THE NIGHT PRIOR TO SURGERY. NOTHING BY MOUTH EXCEPT CLEAR LIQUIDS UNTIL 3 HOURS PRIOR TO [place] SURGERY. PLEASE FINISH ENSURE DRINK PER SURGEON ORDER 3 HOURS PRIOR TO SCHEDULED SURGERY TIME WHICH NEEDS TO BE COMPLETED AT __1000am__________.   Take these medicines the morning of surgery with A SIP OF WATER: Zoloft, eye drops as usual                                 You may not have any metal on your body including hair pins and              piercings  Do not wear jewelry, make-up, lotions, powders or perfumes, deodorant             Do not wear nail polish.  Do not shave  48 hours prior to surgery.              Do not bring valuables to the hospital. [place].  Contacts, dentures or bridgework may not be worn into surgery.  Leave suitcase in the car. After surgery it may be brought to your room.     Name and phone number of your driver:  Special Instructions: coughing and deep breathing exercises, leg exercises               Please read over the following fact sheets you were given: _____________________________________________________________________                CLEAR LIQUID DIET   Foods Allowed                                                                     Foods Excluded  Coffee and tea, regular and decaf                              liquids that you cannot  Plain Jell-O in any flavor                                             see through such as: Fruit ices (not with fruit pulp)  milk, soups, orange juice  Iced Popsicles                                    All solid food                                     Cranberry, grape and apple juices Sports drinks like Gatorade Lightly seasoned clear broth or consume(fat free) Sugar, honey syrup  Sample Menu Breakfast                                Lunch                                     Supper Cranberry juice                    Beef broth                            Chicken broth Jell-O                                     Grape juice                           Apple juice Coffee or tea                        Jell-O                                      Popsicle                                                Coffee or tea                        Coffee or tea  _____________________________________________________________________   WHAT IS A BLOOD TRANSFUSION? Blood Transfusion Information  A transfusion is the replacement of blood or some of its parts. Blood is made up of multiple cells which provide different functions.  Red blood cells carry oxygen and are used for blood loss replacement.  [place] blood cells fight against infection.  Platelets control bleeding.  Plasma helps clot blood.  Other blood products are available for specialized needs, such as hemophilia or other clotting disorders. BEFORE THE TRANSFUSION  Who gives blood for transfusions?   Healthy volunteers who are fully evaluated to make sure their blood is safe. This is blood bank blood. Transfusion therapy is the safest it has ever been in the practice of medicine. Before blood is taken from a donor, a complete history is taken to make sure that person has no history of diseases nor engages in risky social behavior (examples are intravenous drug use  or sexual activity with multiple partners). The donor's travel  history is screened to minimize risk of transmitting infections, such as malaria. The donated blood is tested for signs of infectious diseases, such as HIV and hepatitis. The blood is then tested to be sure it is compatible with you in order to minimize the chance of a transfusion reaction. If you or a relative donates blood, this is often done in anticipation of surgery and is not appropriate for emergency situations. It takes many days to process the donated blood. RISKS AND COMPLICATIONS Although transfusion therapy is very safe and saves many lives, the main dangers of transfusion include:   Getting an infectious disease.  Developing a transfusion reaction. This is an allergic reaction to something in the blood you were given. Every precaution is taken to prevent this. The decision to have a blood transfusion has been considered carefully by your caregiver before blood is given. Blood is not given unless the benefits outweigh the risks. AFTER THE TRANSFUSION  Right after receiving a blood transfusion, you will usually feel much better and more energetic. This is especially true if your red blood cells have gotten low (anemic). The transfusion raises the level of the red blood cells which carry oxygen, and this usually causes an energy increase.  The nurse administering the transfusion will monitor you carefully for complications. HOME CARE INSTRUCTIONS  No special instructions are needed after a transfusion. You may find your energy is better. Speak with your caregiver about any limitations on activity for underlying diseases you may have. SEEK MEDICAL CARE IF:   Your condition is not improving after your transfusion.  You develop redness or irritation at the intravenous (IV) site. SEEK IMMEDIATE MEDICAL CARE IF:  Any of the following symptoms occur over the next 12 hours:  Shaking chills.  You have a temperature by mouth  above 102 F (38.9 C), not controlled by medicine.  Chest, back, or muscle pain.  People around you feel you are not acting correctly or are confused.  Shortness of breath or difficulty breathing.  Dizziness and fainting.  You get a rash or develop hives.  You have a decrease in urine output.  Your urine turns a dark color or changes to pink, red, or brown. Any of the following symptoms occur over the next 10 days:  You have a temperature by mouth above 102 F (38.9 C), not controlled by medicine.  Shortness of breath.  Weakness after normal activity.  The [place] part of the eye turns yellow (jaundice).  You have a decrease in the amount of urine or are urinating less often.  Your urine turns a dark color or changes to pink, red, or brown. Document Released: 01/28/2000 Document Revised: 04/24/2011 Document Reviewed: 09/16/2007 ExitCare Patient Information 2014 [place].  _______________________________________________________________________  Incentive Spirometer  An incentive spirometer is a tool that can help keep your lungs clear and active. This tool measures how well you are filling your lungs with each breath. Taking long deep breaths may help reverse or decrease the chance of developing breathing (pulmonary) problems (especially infection) following:  A long period of time when you are unable to move or be active. BEFORE THE PROCEDURE   If the spirometer includes an indicator to show your best effort, your nurse or respiratory therapist will set it to a desired goal.  If possible, sit up straight or lean slightly forward. Try not to slouch.  Hold the incentive spirometer in an upright position. INSTRUCTIONS FOR USE  1. Sit on the edge of your bed  if possible, or sit up as far as you can in bed or on a chair. 2. Hold the incentive spirometer in an upright position. 3. Breathe out normally. 4. Place the mouthpiece in your mouth and seal your lips tightly  around it. 5. Breathe in slowly and as deeply as possible, raising the piston or the ball toward the top of the column. 6. Hold your breath for 3-5 seconds or for as long as possible. Allow the piston or ball to fall to the bottom of the column. 7. Remove the mouthpiece from your mouth and breathe out normally. 8. Rest for a few seconds and repeat Steps 1 through 7 at least 10 times every 1-2 hours when you are awake. Take your time and take a few normal breaths between deep breaths. 9. The spirometer may include an indicator to show your best effort. Use the indicator as a goal to work toward during each repetition. 10. After each set of 10 deep breaths, practice coughing to be sure your lungs are clear. If you have an incision (the cut made at the time of surgery), support your incision when coughing by placing a pillow or rolled up towels firmly against it. Once you are able to get out of bed, walk around indoors and cough well. You may stop using the incentive spirometer when instructed by your caregiver.  RISKS AND COMPLICATIONS  Take your time so you do not get dizzy or light-headed.  If you are in pain, you may need to take or ask for pain medication before doing incentive spirometry. It is harder to take a deep breath if you are having pain. AFTER USE  Rest and breathe slowly and easily.  It can be helpful to keep track of a log of your progress. Your caregiver can provide you with a simple table to help with this. If you are using the spirometer at home, follow these instructions: [place] IF:   You are having difficultly using the spirometer.  You have trouble using the spirometer as often as instructed.  Your pain medication is not giving enough relief while using the spirometer.  You develop fever of 100.5 F (38.1 C) or higher. SEEK IMMEDIATE MEDICAL CARE IF:   You cough up bloody sputum that had not been present before.  You develop fever of 102 F (38.9 C) or  greater.  You develop worsening pain at or near the incision site. MAKE SURE YOU:   Understand these instructions.  Will watch your condition.  Will get help right away if you are not doing well or get worse. Document Released: 06/12/2006 Document Revised: 04/24/2011 Document Reviewed: 08/13/2006 ExitCare Patient Information 2014 [place].   ________________________________________________________________________ [facility] - Preparing for Surgery Before surgery, you can play an important role.  Because skin is not sterile, your skin needs to be as free of germs as possible.  You can reduce the number of germs on your skin by washing with CHG (chlorahexidine gluconate) soap before surgery.  CHG is an antiseptic cleaner which kills germs and bonds with the skin to continue killing germs even after washing. Please DO NOT use if you have an allergy to CHG or antibacterial soaps.  If your skin becomes reddened/irritated stop using the CHG and inform your nurse when you arrive at Short Stay. Do not shave (including legs and underarms) for at least 48 hours prior to the first CHG shower.  You may shave your face/neck. Please follow these instructions carefully:  1.  Shower with CHG Soap the night before surgery and the  morning of Surgery.  2.  If you choose to wash your hair, wash your hair first as usual with your  normal  shampoo.  3.  After you shampoo, rinse your hair and body thoroughly to remove the  shampoo.                           4.  Use CHG as you would any other liquid soap.  You can apply chg directly  to the skin and wash                       Gently with a scrungie or clean washcloth.  5.  Apply the CHG Soap to your body ONLY FROM THE NECK DOWN.   Do not use on face/ open                           Wound or open sores. Avoid contact with eyes, ears mouth and genitals (private parts).                       Wash face,  Genitals (private parts) with your normal soap.              6.  Wash thoroughly, paying special attention to the area where your surgery  will be performed.  7.  Thoroughly rinse your body with warm water from the neck down.  8.  DO NOT shower/wash with your normal soap after using and rinsing off  the CHG Soap.                9.  Pat yourself dry with a clean towel.            10.  Wear clean pajamas.            11.  Place clean sheets on your bed the night of your first shower and do not  sleep with pets. Day of Surgery : Do not apply any lotions/deodorants the morning of surgery.  Please wear clean clothes to the hospital/surgery center.  FAILURE TO FOLLOW THESE INSTRUCTIONS MAY RESULT IN THE CANCELLATION OF YOUR SURGERY PATIENT SIGNATURE_________________________________  NURSE SIGNATURE__________________________________  ________________________________________________________________________

## 2017-12-25 ENCOUNTER — Other Ambulatory Visit: Payer: Self-pay

## 2017-12-25 ENCOUNTER — Encounter (HOSPITAL_COMMUNITY)
Admission: RE | Admit: 2017-12-25 | Discharge: 2017-12-25 | Disposition: A | Discharge status: Home / Self Care | Payer: BLUE CROSS/BLUE SHIELD | Source: Ambulatory Visit | Attending: Obstetrics | Admitting: Obstetrics

## 2017-12-25 ENCOUNTER — Encounter (HOSPITAL_COMMUNITY): Payer: Self-pay

## 2017-12-25 ENCOUNTER — Ambulatory Visit (HOSPITAL_COMMUNITY)
Admission: RE | Admit: 2017-12-25 | Discharge: 2017-12-25 | Disposition: A | Discharge status: Home / Self Care | Payer: BLUE CROSS/BLUE SHIELD | Source: Ambulatory Visit | Attending: Gynecologic Oncology | Admitting: Gynecologic Oncology

## 2017-12-25 DIAGNOSIS — C541 Malignant neoplasm of endometrium: Secondary | ICD-10-CM | POA: Diagnosis not present

## 2017-12-25 DIAGNOSIS — R001 Bradycardia, unspecified: Secondary | ICD-10-CM | POA: Diagnosis not present

## 2017-12-25 DIAGNOSIS — Z01818 Encounter for other preprocedural examination: Secondary | ICD-10-CM | POA: Diagnosis present

## 2017-12-25 HISTORY — DX: Other complications of anesthesia, initial encounter: T88.59XA

## 2017-12-25 HISTORY — DX: Peripheral vascular disease, unspecified: I73.9

## 2017-12-25 HISTORY — DX: Adverse effect of unspecified anesthetic, initial encounter: T41.45XA

## 2017-12-25 HISTORY — DX: Family history of other specified conditions: Z84.89

## 2017-12-25 HISTORY — DX: Gastro-esophageal reflux disease without esophagitis: K21.9

## 2017-12-25 LAB — CBC
HEMATOCRIT: 41.4 % (ref 36.0–46.0)
HEMOGLOBIN: 13 g/dL (ref 12.0–15.0)
MCH: 28.4 pg (ref 26.0–34.0)
MCHC: 31.4 g/dL (ref 30.0–36.0)
MCV: 90.4 fL (ref 80.0–100.0)
NRBC: 0 % (ref 0.0–0.2)
Platelets: 227 10*3/uL (ref 150–400)
RBC: 4.58 MIL/uL (ref 3.87–5.11)
RDW: 12.8 % (ref 11.5–15.5)
WBC: 8.5 10*3/uL (ref 4.0–10.5)

## 2017-12-25 LAB — COMPREHENSIVE METABOLIC PANEL
ALBUMIN: 4.3 g/dL (ref 3.5–5.0)
ALT: 22 U/L (ref 0–44)
ANION GAP: 8 (ref 5–15)
AST: 22 U/L (ref 15–41)
Alkaline Phosphatase: 82 U/L (ref 38–126)
BUN: 14 mg/dL (ref 8–23)
CO2: 27 mmol/L (ref 22–32)
Calcium: 9.3 mg/dL (ref 8.9–10.3)
Chloride: 106 mmol/L (ref 98–111)
Creatinine, Ser: 0.55 mg/dL (ref 0.44–1.00)
GFR calc Af Amer: >60 mL/min (ref >60)
GFR calc non Af Amer: >60 mL/min (ref >60)
GLUCOSE: 95 mg/dL (ref 70–99)
POTASSIUM: 3.9 mmol/L (ref 3.5–5.1)
Sodium: 141 mmol/L (ref 135–145)
Total Bilirubin: 0.7 mg/dL (ref 0.3–1.2)
Total Protein: 7.1 g/dL (ref 6.5–8.1)

## 2017-12-25 LAB — URINALYSIS, ROUTINE W REFLEX MICROSCOPIC
BILIRUBIN URINE: NEGATIVE
Glucose, UA: NEGATIVE mg/dL
Hgb urine dipstick: NEGATIVE
KETONES UR: NEGATIVE mg/dL
LEUKOCYTES UA: NEGATIVE
NITRITE: NEGATIVE
Protein, ur: NEGATIVE mg/dL
Specific Gravity, Urine: 1.002 — ABNORMAL LOW (ref 1.005–1.030)
pH: 7 (ref 5.0–8.0)

## 2017-12-25 LAB — ABO/RH: ABO/RH(D): A NEG

## 2017-12-25 NOTE — Progress Notes (Signed)
.......  1950/09/27  patient's date of birth is  Your patient has screened at an elevated risk for obstructive sleep apnea using the STOP-Bang tool during a presurgical visit. A score of 5 or greater is an elevated risk. Patient score was 5.

## 2017-12-26 NOTE — Progress Notes (Signed)
Final EKG done 12/25/2017-epic

## 2017-12-27 ENCOUNTER — Ambulatory Visit (HOSPITAL_COMMUNITY)
Admission: RE | Admit: 2017-12-27 | Discharge: 2017-12-28 | Disposition: A | Discharge status: Home / Self Care | Payer: BLUE CROSS/BLUE SHIELD | Source: Ambulatory Visit | Attending: Obstetrics | Admitting: Obstetrics

## 2017-12-27 ENCOUNTER — Ambulatory Visit (HOSPITAL_COMMUNITY): Payer: BLUE CROSS/BLUE SHIELD | Admitting: Anesthesiology

## 2017-12-27 ENCOUNTER — Other Ambulatory Visit: Payer: Self-pay

## 2017-12-27 ENCOUNTER — Encounter (HOSPITAL_COMMUNITY)
Admission: RE | Disposition: A | Discharge status: Home / Self Care | Payer: Self-pay | Source: Ambulatory Visit | Attending: Obstetrics

## 2017-12-27 ENCOUNTER — Encounter (HOSPITAL_COMMUNITY): Payer: Self-pay | Admitting: *Deleted

## 2017-12-27 DIAGNOSIS — Z7982 Long term (current) use of aspirin: Secondary | ICD-10-CM | POA: Insufficient documentation

## 2017-12-27 DIAGNOSIS — I739 Peripheral vascular disease, unspecified: Secondary | ICD-10-CM | POA: Diagnosis not present

## 2017-12-27 DIAGNOSIS — N840 Polyp of corpus uteri: Secondary | ICD-10-CM | POA: Diagnosis not present

## 2017-12-27 DIAGNOSIS — Z79899 Other long term (current) drug therapy: Secondary | ICD-10-CM | POA: Diagnosis not present

## 2017-12-27 DIAGNOSIS — D259 Leiomyoma of uterus, unspecified: Secondary | ICD-10-CM | POA: Diagnosis not present

## 2017-12-27 DIAGNOSIS — C542 Malignant neoplasm of myometrium: Secondary | ICD-10-CM | POA: Diagnosis not present

## 2017-12-27 DIAGNOSIS — N800 Endometriosis of uterus: Secondary | ICD-10-CM | POA: Diagnosis not present

## 2017-12-27 DIAGNOSIS — E785 Hyperlipidemia, unspecified: Secondary | ICD-10-CM | POA: Insufficient documentation

## 2017-12-27 DIAGNOSIS — C541 Malignant neoplasm of endometrium: Secondary | ICD-10-CM | POA: Diagnosis present

## 2017-12-27 DIAGNOSIS — I10 Essential (primary) hypertension: Secondary | ICD-10-CM | POA: Insufficient documentation

## 2017-12-27 DIAGNOSIS — C55 Malignant neoplasm of uterus, part unspecified: Secondary | ICD-10-CM | POA: Diagnosis present

## 2017-12-27 HISTORY — PX: ROBOTIC ASSISTED TOTAL HYSTERECTOMY WITH BILATERAL SALPINGO OOPHERECTOMY: SHX6086

## 2017-12-27 HISTORY — PX: SENTINEL NODE BIOPSY: SHX6608

## 2017-12-27 LAB — TYPE AND SCREEN
ABO/RH(D): A NEG
Antibody Screen: NEGATIVE

## 2017-12-27 SURGERY — HYSTERECTOMY, TOTAL, ROBOT-ASSISTED, LAPAROSCOPIC, WITH BILATERAL SALPINGO-OOPHORECTOMY
Anesthesia: General | Site: Abdomen

## 2017-12-27 MED ORDER — SERTRALINE HCL 25 MG PO TABS
25.0000 mg | ORAL_TABLET | Freq: Every day | ORAL | Status: DC
  Administered 2017-12-28: 25 mg via ORAL
  Filled 2017-12-27: qty 1

## 2017-12-27 MED ORDER — MIDAZOLAM HCL 2 MG/2ML IJ SOLN
INTRAMUSCULAR | Status: DC | PRN
  Administered 2017-12-27: 2 mg via INTRAVENOUS

## 2017-12-27 MED ORDER — ONDANSETRON HCL 4 MG/2ML IJ SOLN
INTRAMUSCULAR | Status: DC | PRN
  Administered 2017-12-27: 4 mg via INTRAVENOUS

## 2017-12-27 MED ORDER — FENTANYL CITRATE (PF) 100 MCG/2ML IJ SOLN
INTRAMUSCULAR | Status: AC
  Filled 2017-12-27: qty 2

## 2017-12-27 MED ORDER — ENOXAPARIN SODIUM 40 MG/0.4ML ~~LOC~~ SOLN
40.0000 mg | SUBCUTANEOUS | Status: DC
  Administered 2017-12-28: 40 mg via SUBCUTANEOUS
  Filled 2017-12-27: qty 0.4

## 2017-12-27 MED ORDER — LACTATED RINGERS IV SOLN
INTRAVENOUS | Status: DC
  Administered 2017-12-27 (×2): via INTRAVENOUS

## 2017-12-27 MED ORDER — FENTANYL CITRATE (PF) 100 MCG/2ML IJ SOLN: 25.0000 ug | INTRAMUSCULAR | Status: DC | PRN

## 2017-12-27 MED ORDER — PROPOFOL 10 MG/ML IV BOLUS
INTRAVENOUS | Status: DC | PRN
  Administered 2017-12-27: 100 mg via INTRAVENOUS

## 2017-12-27 MED ORDER — OXYCODONE HCL 5 MG PO TABS: 5.0000 mg | ORAL_TABLET | ORAL | Status: DC | PRN

## 2017-12-27 MED ORDER — CELECOXIB 200 MG PO CAPS
400.0000 mg | ORAL_CAPSULE | ORAL | Status: AC
  Administered 2017-12-27: 400 mg via ORAL
  Filled 2017-12-27: qty 2

## 2017-12-27 MED ORDER — ONDANSETRON HCL 4 MG/2ML IJ SOLN
INTRAMUSCULAR | Status: AC
  Filled 2017-12-27: qty 2

## 2017-12-27 MED ORDER — ACETAMINOPHEN 500 MG PO TABS
1000.0000 mg | ORAL_TABLET | ORAL | Status: AC
  Administered 2017-12-27: 1000 mg via ORAL
  Filled 2017-12-27: qty 2

## 2017-12-27 MED ORDER — FENTANYL CITRATE (PF) 250 MCG/5ML IJ SOLN
INTRAMUSCULAR | Status: DC | PRN
  Administered 2017-12-27 (×2): 50 ug via INTRAVENOUS
  Administered 2017-12-27: 100 ug via INTRAVENOUS
  Administered 2017-12-27 (×3): 50 ug via INTRAVENOUS

## 2017-12-27 MED ORDER — STERILE WATER FOR INJECTION IJ SOLN
INTRAMUSCULAR | Status: DC | PRN
  Administered 2017-12-27: 10 mL

## 2017-12-27 MED ORDER — ONDANSETRON HCL 4 MG PO TABS: 4.0000 mg | ORAL_TABLET | Freq: Four times a day (QID) | ORAL | Status: DC | PRN

## 2017-12-27 MED ORDER — STERILE WATER FOR IRRIGATION IR SOLN
Status: DC | PRN
  Administered 2017-12-27: 1000 mL

## 2017-12-27 MED ORDER — PRAVASTATIN SODIUM 20 MG PO TABS: 10.0000 mg | ORAL_TABLET | Freq: Every day | ORAL | Status: DC

## 2017-12-27 MED ORDER — TRAMADOL HCL 50 MG PO TABS: 100.0000 mg | ORAL_TABLET | Freq: Four times a day (QID) | ORAL | Status: DC | PRN

## 2017-12-27 MED ORDER — PROPOFOL 10 MG/ML IV BOLUS
INTRAVENOUS | Status: AC
  Filled 2017-12-27: qty 20

## 2017-12-27 MED ORDER — FENTANYL CITRATE (PF) 250 MCG/5ML IJ SOLN
INTRAMUSCULAR | Status: AC
  Filled 2017-12-27: qty 5

## 2017-12-27 MED ORDER — HYDROMORPHONE HCL 1 MG/ML IJ SOLN: 0.5000 mg | INTRAMUSCULAR | Status: DC | PRN

## 2017-12-27 MED ORDER — ONDANSETRON HCL 4 MG/2ML IJ SOLN: 4.0000 mg | Freq: Four times a day (QID) | INTRAMUSCULAR | Status: DC | PRN

## 2017-12-27 MED ORDER — SUGAMMADEX SODIUM 200 MG/2ML IV SOLN
INTRAVENOUS | Status: DC | PRN
  Administered 2017-12-27: 175 mg via INTRAVENOUS

## 2017-12-27 MED ORDER — CEFAZOLIN SODIUM-DEXTROSE 2-4 GM/100ML-% IV SOLN
2.0000 g | INTRAVENOUS | Status: AC
  Administered 2017-12-27: 2 g via INTRAVENOUS
  Filled 2017-12-27: qty 100

## 2017-12-27 MED ORDER — EPHEDRINE 5 MG/ML INJ
INTRAVENOUS | Status: AC
  Filled 2017-12-27: qty 10

## 2017-12-27 MED ORDER — IBUPROFEN 200 MG PO TABS
600.0000 mg | ORAL_TABLET | Freq: Four times a day (QID) | ORAL | Status: DC
  Administered 2017-12-28: 600 mg via ORAL
  Filled 2017-12-27: qty 3

## 2017-12-27 MED ORDER — SCOPOLAMINE 1 MG/3DAYS TD PT72
1.0000 | MEDICATED_PATCH | TRANSDERMAL | Status: DC
  Administered 2017-12-27: 1.5 mg via TRANSDERMAL
  Filled 2017-12-27: qty 1

## 2017-12-27 MED ORDER — HYDROMORPHONE HCL 1 MG/ML IJ SOLN: 0.2500 mg | INTRAMUSCULAR | Status: DC | PRN

## 2017-12-27 MED ORDER — STERILE WATER FOR INJECTION IJ SOLN
INTRAMUSCULAR | Status: AC
  Filled 2017-12-27: qty 10

## 2017-12-27 MED ORDER — ASPIRIN EC 81 MG PO TBEC: 81.0000 mg | DELAYED_RELEASE_TABLET | Freq: Every evening | ORAL | Status: DC

## 2017-12-27 MED ORDER — DEXAMETHASONE SODIUM PHOSPHATE 10 MG/ML IJ SOLN
INTRAMUSCULAR | Status: DC | PRN
  Administered 2017-12-27: 10 mg via INTRAVENOUS

## 2017-12-27 MED ORDER — LIDOCAINE 2% (20 MG/ML) 5 ML SYRINGE
INTRAMUSCULAR | Status: DC | PRN
  Administered 2017-12-27: 100 mg via INTRAVENOUS

## 2017-12-27 MED ORDER — ROCURONIUM BROMIDE 10 MG/ML (PF) SYRINGE
PREFILLED_SYRINGE | INTRAVENOUS | Status: DC | PRN
  Administered 2017-12-27: 60 mg via INTRAVENOUS
  Administered 2017-12-27: 20 mg via INTRAVENOUS

## 2017-12-27 MED ORDER — DEXAMETHASONE SODIUM PHOSPHATE 10 MG/ML IJ SOLN
INTRAMUSCULAR | Status: AC
  Filled 2017-12-27: qty 1

## 2017-12-27 MED ORDER — LIDOCAINE 2% (20 MG/ML) 5 ML SYRINGE
INTRAMUSCULAR | Status: AC
  Filled 2017-12-27: qty 5

## 2017-12-27 MED ORDER — GABAPENTIN 300 MG PO CAPS
600.0000 mg | ORAL_CAPSULE | Freq: Every day | ORAL | Status: AC
  Administered 2017-12-27: 600 mg via ORAL
  Filled 2017-12-27: qty 2

## 2017-12-27 MED ORDER — SUGAMMADEX SODIUM 200 MG/2ML IV SOLN
INTRAVENOUS | Status: AC
  Filled 2017-12-27: qty 2

## 2017-12-27 MED ORDER — LISINOPRIL 20 MG PO TABS
20.0000 mg | ORAL_TABLET | Freq: Every day | ORAL | Status: DC
  Administered 2017-12-28: 20 mg via ORAL
  Filled 2017-12-27: qty 1

## 2017-12-27 MED ORDER — ORAL CARE MOUTH RINSE
15.0000 mL | Freq: Two times a day (BID) | OROMUCOSAL | Status: DC
  Administered 2017-12-27: 15 mL via OROMUCOSAL

## 2017-12-27 MED ORDER — DEXAMETHASONE SODIUM PHOSPHATE 4 MG/ML IJ SOLN: 4.0000 mg | INTRAMUSCULAR | Status: DC

## 2017-12-27 MED ORDER — MIDAZOLAM HCL 2 MG/2ML IJ SOLN
INTRAMUSCULAR | Status: AC
  Filled 2017-12-27: qty 2

## 2017-12-27 MED ORDER — DOCUSATE SODIUM 100 MG PO CAPS
100.0000 mg | ORAL_CAPSULE | Freq: Two times a day (BID) | ORAL | Status: DC
  Administered 2017-12-27 – 2017-12-28 (×2): 100 mg via ORAL
  Filled 2017-12-27 (×2): qty 1

## 2017-12-27 MED ORDER — GABAPENTIN 300 MG PO CAPS
300.0000 mg | ORAL_CAPSULE | ORAL | Status: AC
  Administered 2017-12-27: 300 mg via ORAL
  Filled 2017-12-27: qty 1

## 2017-12-27 MED ORDER — EPHEDRINE SULFATE-NACL 50-0.9 MG/10ML-% IV SOSY
PREFILLED_SYRINGE | INTRAVENOUS | Status: DC | PRN
  Administered 2017-12-27 (×2): 10 mg via INTRAVENOUS

## 2017-12-27 MED ORDER — ACETAMINOPHEN 500 MG PO TABS
1000.0000 mg | ORAL_TABLET | Freq: Four times a day (QID) | ORAL | Status: DC
  Administered 2017-12-28 (×2): 1000 mg via ORAL
  Filled 2017-12-27 (×2): qty 2

## 2017-12-27 MED ORDER — KCL IN DEXTROSE-NACL 20-5-0.45 MEQ/L-%-% IV SOLN
INTRAVENOUS | Status: DC
  Administered 2017-12-27: 20:00:00 via INTRAVENOUS
  Filled 2017-12-27: qty 1000

## 2017-12-27 MED ORDER — ROCURONIUM BROMIDE 10 MG/ML (PF) SYRINGE
PREFILLED_SYRINGE | INTRAVENOUS | Status: AC
  Filled 2017-12-27: qty 10

## 2017-12-27 SURGICAL SUPPLY — 69 items
APPLICATOR COTTON TIP 6 STRL (MISCELLANEOUS) ×2 IMPLANT
APPLICATOR COTTON TIP 6IN STRL (MISCELLANEOUS) ×4
APPLICATOR SURGIFLO ENDO (HEMOSTASIS) IMPLANT
BAG LAPAROSCOPIC 12 15 PORT 16 (BASKET) IMPLANT
BAG RETRIEVAL 12/15 (BASKET)
BAG RETRIEVAL 12/15MM (BASKET)
COVER BACK TABLE 60X90IN (DRAPES) ×4 IMPLANT
COVER TIP SHEARS 8 DVNC (MISCELLANEOUS) ×2 IMPLANT
COVER TIP SHEARS 8MM DA VINCI (MISCELLANEOUS) ×2
COVER WAND RF STERILE (DRAPES) ×4 IMPLANT
DERMABOND ADVANCED (GAUZE/BANDAGES/DRESSINGS) ×2
DERMABOND ADVANCED .7 DNX12 (GAUZE/BANDAGES/DRESSINGS) ×2 IMPLANT
DRAPE ARM DVNC X/XI (DISPOSABLE) ×8 IMPLANT
DRAPE COLUMN DVNC XI (DISPOSABLE) ×2 IMPLANT
DRAPE DA VINCI XI ARM (DISPOSABLE) ×8
DRAPE DA VINCI XI COLUMN (DISPOSABLE) ×2
DRAPE SHEET LG 3/4 BI-LAMINATE (DRAPES) ×4 IMPLANT
DRAPE SURG IRRIG POUCH 19X23 (DRAPES) ×4 IMPLANT
DRAPE UNDERBUTTOCKS STRL (DRAPE) ×4 IMPLANT
ELECT REM PT RETURN 15FT ADLT (MISCELLANEOUS) ×4 IMPLANT
GLOVE BIO SURGEON STRL SZ 6.5 (GLOVE) IMPLANT
GLOVE BIO SURGEONS STRL SZ 6.5 (GLOVE)
GLOVE BIOGEL PI IND STRL 7.0 (GLOVE) ×6 IMPLANT
GLOVE BIOGEL PI INDICATOR 7.0 (GLOVE) ×6
GLOVE SURG SS PI 6.5 STRL IVOR (GLOVE) ×12 IMPLANT
GOWN STRL REUS W/ TWL LRG LVL3 (GOWN DISPOSABLE) ×2 IMPLANT
GOWN STRL REUS W/TWL LRG LVL3 (GOWN DISPOSABLE) ×2
GOWN STRL REUS W/TWL XL LVL3 (GOWN DISPOSABLE) ×8 IMPLANT
GYRUS RUMI II 2.5CM BLUE (DISPOSABLE) ×4
GYRUS RUMI II 3.5CM BLUE (DISPOSABLE)
HOLDER FOLEY CATH W/STRAP (MISCELLANEOUS) ×4 IMPLANT
IRRIG SUCT STRYKERFLOW 2 WTIP (MISCELLANEOUS) ×4
IRRIGATION SUCT STRKRFLW 2 WTP (MISCELLANEOUS) ×2 IMPLANT
KIT PROCEDURE DA VINCI SI (MISCELLANEOUS) ×2
KIT PROCEDURE DVNC SI (MISCELLANEOUS) ×2 IMPLANT
NEEDLE HYPO 25X1 1.5 SAFETY (NEEDLE) ×4 IMPLANT
NEEDLE SPNL 18GX3.5 QUINCKE PK (NEEDLE) ×4 IMPLANT
OBTURATOR OPTICAL STANDARD 8MM (TROCAR) ×2
OBTURATOR OPTICAL STND 8 DVNC (TROCAR) ×2
OBTURATOR OPTICALSTD 8 DVNC (TROCAR) ×2 IMPLANT
PACK ROBOT GYN CUSTOM WL (TRAY / TRAY PROCEDURE) ×4 IMPLANT
PAD POSITIONING PINK XL (MISCELLANEOUS) ×4 IMPLANT
PORT ACCESS TROCAR AIRSEAL 12 (TROCAR) ×2 IMPLANT
PORT ACCESS TROCAR AIRSEAL 5M (TROCAR) ×2
POUCH SPECIMEN RETRIEVAL 10MM (ENDOMECHANICALS) ×4 IMPLANT
RUMI II 3.0CM BLUE KOH-EFFICIE (DISPOSABLE) IMPLANT
RUMI II GYRUS 2.5CM BLUE (DISPOSABLE) ×2 IMPLANT
RUMI II GYRUS 3.5CM BLUE (DISPOSABLE) IMPLANT
SEAL CANN UNIV 5-8 DVNC XI (MISCELLANEOUS) ×8 IMPLANT
SEAL XI 5MM-8MM UNIVERSAL (MISCELLANEOUS) ×8
SET TRI-LUMEN FLTR TB AIRSEAL (TUBING) ×4 IMPLANT
SURGIFLO W/THROMBIN 8M KIT (HEMOSTASIS) IMPLANT
SUT MNCRL AB 4-0 PS2 18 (SUTURE) ×8 IMPLANT
SUT VIC AB 0 CT1 27 (SUTURE)
SUT VIC AB 0 CT1 27XBRD ANTBC (SUTURE) IMPLANT
SUT VIC AB 4-0 PS2 27 (SUTURE) ×8 IMPLANT
SUT VLOC 180 0 9IN  GS21 (SUTURE) ×2
SUT VLOC 180 0 9IN GS21 (SUTURE) ×2 IMPLANT
SYR 10ML LL (SYRINGE) ×4 IMPLANT
TIP RUMI ORANGE 6.7MMX12CM (TIP) IMPLANT
TIP UTERINE 5.1X6CM LAV DISP (MISCELLANEOUS) IMPLANT
TIP UTERINE 6.7X10CM GRN DISP (MISCELLANEOUS) IMPLANT
TIP UTERINE 6.7X6CM WHT DISP (MISCELLANEOUS) ×4 IMPLANT
TIP UTERINE 6.7X8CM BLUE DISP (MISCELLANEOUS) IMPLANT
TOWEL OR NON WOVEN STRL DISP B (DISPOSABLE) ×4 IMPLANT
TRAP SPECIMEN MUCOUS 40CC (MISCELLANEOUS) ×4 IMPLANT
TRAY FOLEY MTR SLVR 16FR STAT (SET/KITS/TRAYS/PACK) ×4 IMPLANT
UNDERPAD 30X30 (UNDERPADS AND DIAPERS) ×4 IMPLANT
WATER STERILE IRR 1000ML POUR (IV SOLUTION) ×4 IMPLANT

## 2017-12-27 NOTE — Interval H&P Note (Signed)
History and Physical Interval Note:  12/27/2017 1:36 PM  Whitney Vaughan  has presented today for surgery, with the diagnosis of ENDOMETRIAL CANCER  The various methods of treatment have been discussed with the patient and family. After consideration of risks, benefits and other options for treatment, the patient has consented to  Procedure(s): XI ROBOTIC ASSISTED TOTAL HYSTERECTOMY WITH BILATERAL SALPINGO OOPHORECTOMY, POSSIBLE STAGING (Bilateral) SENTINEL NODE BIOPSY (N/A) as a surgical intervention .  The patient's history has been reviewed, patient examined, no change in status, stable for surgery.  I have reviewed the patient's chart and labs.  Questions were answered to the patient's satisfaction.     Isabel Caprice

## 2017-12-27 NOTE — Op Note (Signed)
OPERATIVE NOTE 12/27/17   Surgeon: Mart Piggs, MD  Assistants: Lahoma Crocker, MD (an MD assistant was necessary for tissue manipulation, management of robotic instrumentation, retraction and positioning due to the complexity of the case and hospital policies).   Pre-operative Diagnosis: Endometrial cancer grade 1  Post-operative Diagnosis: same, pending final pathology  Operation:  1. Robotic-assisted laparoscopic total hysterectomy with bilateral salpingoophorectomy 2. SLN biopsy 3. Pelvic washings 4. Right pelvic lymph sampling  Anesthesia: General endotracheal anesthesia   Findings: No gross disease. Frozen section revealed carcinoma invading less than 50%. Sentinel uptake on left to obturator and external iliac. Sentinel accumulated on right in obturator space but became obscured during dissection.  Estimated Blood Loss:  less than 50 mL             Specimens: Washings, uterus, cervix, bilateral tubes and ovaries, left external iliac and obturator sentinel lymph nodes, right obturator and external iliac non-sentinel lymph nodes.         Complications:  None; patient tolerated the procedure well.         Disposition: PACU - hemodynamically stable.  Procedure Details  After induction of anesthesia the patient was placed in lithotomy position. Her arms were tucked to her side with all appropriate precautions.  The shoulders were stabilized with padded shoulder blocks applied to the acromium processes.  The perineum was prepped with Betadine. CholoraPrep was used to prep the abdomen and allowed to dry for 3 minutes.  The patient was then draped.   A time out was performed. A Foley catheter was placed by me.  A sterile speculum was placed in the vagina.  The cervix was grasped with a single-tooth tenaculum. 2mg  total of ICG was injected into the cervical stroma at 3 and 9 o'clock with 1cc injected at a 1cm and 56mm depth (concentration 0.5mg /ml) in all locations. The  cervix was dilated with Kennon Rounds dilators.  The RUMI II uterine 6cm manipulator with a 2.5 colpotomizer ring was placed without difficulty.  OG tube placement was confirmed.   Next, a 10 mm skin incision was made 2 cm below the subcostal margin just medial to the midclavicular line.  The 5 mm Optiview port and scope was used for direct entry.  Opening pressure was under 10 mm CO2.  The abdomen was insufflated and the findings were noted as above.   At this point and all points during the procedure, the patient's intra-abdominal pressure did not exceed 15 mmHg. The patient was placed in steep Trendelenburg.  Next, an 31mm skin incision was made superior to the umbilicus and a right and left port was placed about 6-8 cm lateral to the robot port on the right and left side.  A fourth arm was placed on the right lateral abdomen, 6-8cm from the last. The 25mm Optiview was changed out for a 51mm Airseal. All ports were placed under direct visualization.  Bowel was folded away into the upper abdomen.  A single Raytec placed. The robot was docked in the normal manner.  The round ligaments were elevated, coagulated, and transected. The right and left peritoneum were opened parallel to the IP ligament to open the retroperitoneal spaces bilaterally. The SLN mapping was performed in bilateral pelvic basins. The para rectal and paravesical spaces were opened up entirely with careful dissection below the level of the ureters bilaterally and to the depth of the uterine artery origin in order to skeletonize the uterine "web" and ensure visualization of all parametrial channels. The  para-aortic basins were carefully exposed and evaluated for isolated para-aortic SLN's. Lymphatic channels were identified travelling to the sentinel lymph nodes as noted in the findings. These SLN's were separated from their surrounding lymphatic tissue, removed and sent for permanent pathology.  The hysterectomy was initiated. The ureter was noted to  be on the medial leaf of the broad ligament.  The peritoneum above the ureter was incised and stretched and the infundibulopelvic ligament was skeletonized, cauterized and transected.  The posterior peritoneum was taken down to the level of the KOH ring.  The anterior peritoneum was also taken down.  The bladder flap was created to the level of the KOH ring.  The uterine artery on the was skeletonized, cauterized, and transected in the normal manner.  A similar procedure was performed on the contralateral side. The colpotomy was made and the uterus, cervix, bilateral ovaries and tubes were amputated and delivered through the vagina.  Pedicles were inspected and excellent hemostasis was achieved.    The uterus was sent for frozen section.  The colpotomy at the vaginal cuff was closed with one running 9-inch V-loc suture meeting in the midline and traveling back for another 2 bites. The suture was cut and the needles removed through the 50mm airseal trocar. Irrigation was used and excellent hemostasis was achieved.    Additional right pelvic lymph nodes were removed while awaiting the frozen section.  Frozen section returned with < 50% invasion therefore the lymph node packet was dissected free and sent as noted.  The Raytec was removed.  Robotic instruments were removed under direct visualization.  The robot was undocked. The 44mm port fascia was closed with a 0-vicryl in the usual fashion. The Airseal was shut down. The mmHg noted to go to 0. The trocars were removed. The skin was closed with 4-0 Vicryl in the dermis. Then 4-0 monocryl was used in a subcuticular manner.  Dermabond was applied.    The balloon was removed from the vagina. Sponge, lap and needle counts correct x 2.  The patient was taken to the recovery room in stable condition.

## 2017-12-27 NOTE — Transfer of Care (Signed)
Immediate Anesthesia Transfer of Care Note  Patient: Whitney Vaughan  Procedure(s) Performed: Procedure(s): XI ROBOTIC ASSISTED TOTAL HYSTERECTOMY WITH BILATERAL SALPINGO OOPHORECTOMY,  STAGING (Bilateral) SENTINEL NODE BIOPSY (N/A)  Patient Location: PACU  Anesthesia Type:General  Level of Consciousness:  sedated, patient cooperative and responds to stimulation  Airway & Oxygen Therapy:Patient Spontanous Breathing and Patient connected to face mask oxgen  Post-op Assessment:  Report given to PACU RN and Post -op Vital signs reviewed and stable  Post vital signs:  Reviewed and stable  Last Vitals:  Vitals:   12/27/17 1042  BP: (!) 170/64  Pulse: 69  Resp: 18  Temp: 36.9 C  SpO2: 29%    Complications: No apparent anesthesia complications

## 2017-12-27 NOTE — Anesthesia Procedure Notes (Signed)
Procedure Name: Intubation Date/Time: 12/27/2017 2:21 PM Performed by: Sharlette Dense, CRNA Patient Re-evaluated:Patient Re-evaluated prior to induction Oxygen Delivery Method: Circle system utilized Preoxygenation: Pre-oxygenation with 100% oxygen Induction Type: IV induction Ventilation: Mask ventilation without difficulty and Oral airway inserted - appropriate to patient size Laryngoscope Size: Sabra Heck and 2 Grade View: Grade I Tube type: Oral Tube size: 7.5 mm Number of attempts: 1 Airway Equipment and Method: Stylet Placement Confirmation: ETT inserted through vocal cords under direct vision,  positive ETCO2 and breath sounds checked- equal and bilateral Secured at: 21 cm Tube secured with: Tape Dental Injury: Teeth and Oropharynx as per pre-operative assessment

## 2017-12-27 NOTE — Anesthesia Preprocedure Evaluation (Addendum)
Anesthesia Evaluation  Patient identified by MRN, date of birth, ID band Patient awake    Reviewed: Allergy & Precautions, NPO status , Patient's Chart, lab work & pertinent test results  History of Anesthesia Complications (+) Family history of anesthesia reaction  Airway Mallampati: II  TM Distance: >3 FB     Dental   Pulmonary    breath sounds clear to auscultation       Cardiovascular hypertension, + Peripheral Vascular Disease   Rhythm:Regular Rate:Normal  Patient examined. Chart reviewed. ECG noted form 11/12. Case discussed with Dr. Gerarda Fraction and Dr. Skeet Latch. Dr. Oval Linsey reviewed Epic chart ECG from 11/12 and stated based on review no ECG concerns and ok to proceed. This was reported to Dr. Gerarda Fraction. Repeat ECG done which revealed sinus bradycardia, otherwise normal ECG. CG   Neuro/Psych    GI/Hepatic Neg liver ROS, GERD  ,  Endo/Other  negative endocrine ROS  Renal/GU negative Renal ROS     Musculoskeletal   Abdominal   Peds  Hematology   Anesthesia Other Findings   Reproductive/Obstetrics                            Anesthesia Physical Anesthesia Plan  ASA: III  Anesthesia Plan: General   Post-op Pain Management:    Induction: Intravenous  PONV Risk Score and Plan: 3 and Ondansetron, Dexamethasone and Midazolam  Airway Management Planned: Oral ETT  Additional Equipment:   Intra-op Plan:   Post-operative Plan: Possible Post-op intubation/ventilation  Informed Consent: I have reviewed the patients History and Physical, chart, labs and discussed the procedure including the risks, benefits and alternatives for the proposed anesthesia with the patient or authorized representative who has indicated his/her understanding and acceptance.   Dental advisory given  Plan Discussed with: CRNA and Anesthesiologist  Anesthesia Plan Comments:         Anesthesia Quick  Evaluation

## 2017-12-28 ENCOUNTER — Encounter (HOSPITAL_COMMUNITY): Payer: Self-pay | Admitting: Obstetrics

## 2017-12-28 DIAGNOSIS — C541 Malignant neoplasm of endometrium: Secondary | ICD-10-CM | POA: Diagnosis not present

## 2017-12-28 LAB — BASIC METABOLIC PANEL
Anion gap: 9 (ref 5–15)
BUN: 11 mg/dL (ref 8–23)
CALCIUM: 8.8 mg/dL — AB (ref 8.9–10.3)
CHLORIDE: 106 mmol/L (ref 98–111)
CO2: 24 mmol/L (ref 22–32)
CREATININE: 0.73 mg/dL (ref 0.44–1.00)
GFR calc non Af Amer: >60 mL/min (ref >60)
Glucose, Bld: 145 mg/dL — ABNORMAL HIGH (ref 70–99)
Potassium: 4.6 mmol/L (ref 3.5–5.1)
SODIUM: 139 mmol/L (ref 135–145)

## 2017-12-28 LAB — CBC
HCT: 39 % (ref 36.0–46.0)
HEMOGLOBIN: 12.3 g/dL (ref 12.0–15.0)
MCH: 27.9 pg (ref 26.0–34.0)
MCHC: 31.5 g/dL (ref 30.0–36.0)
MCV: 88.4 fL (ref 80.0–100.0)
NRBC: 0 % (ref 0.0–0.2)
Platelets: 235 10*3/uL (ref 150–400)
RBC: 4.41 MIL/uL (ref 3.87–5.11)
RDW: 12.8 % (ref 11.5–15.5)
WBC: 12.6 10*3/uL — AB (ref 4.0–10.5)

## 2017-12-28 MED ORDER — SENNOSIDES-DOCUSATE SODIUM 8.6-50 MG PO TABS: 1.0000 | ORAL_TABLET | Freq: Every day | ORAL | 1 refills | Status: AC

## 2017-12-28 MED ORDER — TRAMADOL HCL 50 MG PO TABS: 50.0000 mg | ORAL_TABLET | Freq: Four times a day (QID) | ORAL | 0 refills | Status: DC | PRN

## 2017-12-28 MED ORDER — ASPIRIN EC 81 MG PO TBEC: 81.0000 mg | DELAYED_RELEASE_TABLET | Freq: Every evening | ORAL | Status: AC

## 2017-12-28 NOTE — Discharge Summary (Signed)
Physician Discharge Summary  Patient ID: TRAVONNA SWINDLE MRN: [id number] DOB/AGE: [birth year] 67 y.o.  Admit date: 12/27/2017 Discharge date: 12/28/2017  Admission Diagnoses: Uterine cancer [facility])  Discharge Diagnoses:  Principal Problem:   Uterine cancer [facility]) Active Problems:   Endometrial cancer [facility])   Discharged Condition:  The patient is in good condition and stable for discharge.    Hospital Course: On 12/27/2017, the patient underwent the following: Procedure(s): XI ROBOTIC ASSISTED TOTAL HYSTERECTOMY WITH BILATERAL SALPINGO OOPHORECTOMY,  STAGING SENTINEL NODE BIOPSY.  The postoperative course was uneventful.  She was discharged to home on postoperative day 1 tolerating a regular diet, passing flatus, ambulating without difficulty, voiding, pain controlled . She is to resume her aspirin 81 mg 72 hours after surgery per Dr. Gerarda Fraction.  Consults: None  Significant Diagnostic Studies: None  Treatments: surgery: see above  Discharge Exam: Blood pressure 132/61, pulse (!) 55, temperature 97.6 F (36.4 C), temperature source Oral, resp. rate 18, height 5\' 6"  (1.676 m), weight 173 lb 1 oz (78.5 kg), SpO2 97 %. General appearance: alert, cooperative, appears stated age and no distress Resp: clear to auscultation bilaterally Cardio: regular rate and rhythm, S1, S2 normal, no murmur, click, rub or gallop GI: soft, non-tender; bowel sounds normal; no masses,  no organomegaly Extremities: extremities normal, atraumatic, no cyanosis or edema Incision/Wound: Lap sites to the abdomen with dermabond without erythema or drainage  Disposition: Discharge disposition: 01-Home or Self Care       Discharge Instructions    Call MD for:  difficulty breathing, headache or visual disturbances   Complete by:  As directed    Call MD for:  extreme fatigue   Complete by:  As directed    Call MD for:  hives   Complete by:  As directed    Call MD for:  persistant dizziness or  light-headedness   Complete by:  As directed    Call MD for:  persistant nausea and vomiting   Complete by:  As directed    Call MD for:  redness, tenderness, or signs of infection (pain, swelling, redness, odor or green/yellow discharge around incision site)   Complete by:  As directed    Call MD for:  severe uncontrolled pain   Complete by:  As directed    Call MD for:  temperature >100.4   Complete by:  As directed    Diet - low sodium heart healthy   Complete by:  As directed    Driving Restrictions   Complete by:  As directed    No driving for 1 week if you were cleared to drive prior to surgery.  Do not take narcotics and drive.   Increase activity slowly   Complete by:  As directed    Lifting restrictions   Complete by:  As directed    No lifting greater than 10 lbs.   Sexual Activity Restrictions   Complete by:  As directed    No sexual activity, nothing in the vagina, for 12 weeks.     Allergies as of 12/28/2017   No Known Allergies     Medication List    TAKE these medications   aspirin EC 81 MG tablet Take 1 tablet (81 mg total) by mouth every evening. Begin taking 3 days after surgery What changed:  additional instructions   CALTRATE 600+D3 PO Take 1 tablet by mouth daily.   lisinopril 20 MG tablet Commonly known as:  PRINIVIL,ZESTRIL Take 20 mg by mouth daily.  pravastatin 10 MG tablet Commonly known as:  PRAVACHOL Take 10 mg by mouth daily after supper.   PRESERVISION AREDS 2 PO Take 1 tablet by mouth 2 (two) times daily.   senna-docusate 8.6-50 MG tablet Commonly known as:  Senokot-S Take 1 tablet by mouth at bedtime. Stop if having loose stools   sertraline 25 MG tablet Commonly known as:  ZOLOFT Take 1 tablet (25 mg total) by mouth daily.   traMADol 50 MG tablet Commonly known as:  ULTRAM Take 1 tablet (50 mg total) by mouth every 6 (six) hours as needed for severe pain.   TYLENOL 8 HOUR ARTHRITIS PAIN 650 MG CR tablet Generic drug:   acetaminophen Take 650 mg by mouth at bedtime.      Follow-up Information    Isabel Caprice, MD Follow up on 01/09/2018.   Specialty:  Gynecologic Oncology Why:  at 2pm at the [facility] information: [place] [address] [phone]           Greater than thirty minutes were spend for face to face discharge instructions and discharge orders/summary in EPIC.   Signed: Dorothyann Gibbs 12/28/2017, 11:08 AM

## 2017-12-28 NOTE — Discharge Instructions (Addendum)
12/27/2017  Activity: 1. Be up and out of the bed during the day.  Take a nap if needed.  You may walk up steps but be careful and use the hand rail.  Stair climbing will tire you more than you think, you may need to stop part way and rest.   2. No lifting, pulling, pushing, or straining anything more than 5 pounds for 6 weeks. You may be able to return to full activity at 4 weeks, but this will be further discussed at your postoperative visits.  3. No driving for 2 weeks.  Do Not drive if you are taking narcotic pain medicine.  4. Shower daily.  Use soap and water on your incision and pat dry; don't rub.   5. No sexual activity and nothing in the vagina until discussed with Dr. Gerarda Fraction (around 12 weeks).  Medications:  - As long as you have never been told to avoid ibuprofen and/or tylenol use these first for pain control. Take these regularly (every 6 hours) to decrease the build up of pain.  - If necessary, for severe pain not relieved by the above, take your pain prescription.  - While taking your prescription pain medication you should take stool softener (I prefer you purchase docusate sodium "Colace") 2-3 times per day to reduce the likelihood of constipation. If this causes diarrhea, stop its use.  -YOU CAN RESUME YOUR ASPIRIN 72 HOURS AFTER SURGERY (NOV 18)  Diet: 1. Low sodium Heart Healthy Diet is recommended. 2. It is safe to use a gentle laxative if you have difficulty moving your bowels as long as you have no nausea or vomiting and you are passing flatus.  Wound Care: 1. Keep clean and dry.  Shower daily. 2. If you have steri-strips on your incision these will fall off after 2-3 weeks. At 3 weeks you may take these off carefully after showering.   Reasons to call the Doctor:  Fever - Oral temperature greater than 100.4 degrees Fahrenheit  Foul-smelling vaginal discharge  Difficulty urinating  Nausea and vomiting  Increased pain at the site of the incision that is  unrelieved with pain medicine.  Difficulty breathing with or without chest pain  New calf pain especially if only on one side  Sudden, continuing increased vaginal bleeding/drainage with or without clots.   Follow-up: 1. See Dr. Gerarda Fraction in 1-2 weeks.  Contacts: For questions or concerns you should contact:  Our office [phone] After hours and on week-ends for urgent issues relating to our treatment for you, please call [phone] and ask to speak to the physician on call for Gynecologic Oncology  We will not refill pain medications if we are not your primary provider or after hours. Plan accordingly if you are running low.

## 2018-01-07 NOTE — Anesthesia Postprocedure Evaluation (Signed)
Anesthesia Post Note  Patient: Whitney Vaughan [facility]  Procedure(s) Performed: XI ROBOTIC ASSISTED TOTAL HYSTERECTOMY WITH BILATERAL SALPINGO OOPHORECTOMY,  STAGING (Bilateral Abdomen) SENTINEL NODE BIOPSY (N/A Abdomen)     Anesthesia Post Evaluation  Last Vitals:  Vitals:   12/28/17 0224 12/28/17 0626  BP: (!) 128/59 132/61  Pulse: 66 (!) 55  Resp: 16 18  Temp: (!) 36.3 C 36.4 C  SpO2: 96% 97%    Last Pain:  Vitals:   12/28/17 0900  TempSrc:   PainSc: 0-No pain                 Whitney Vaughan

## 2018-01-09 ENCOUNTER — Inpatient Hospital Stay (HOSPITAL_BASED_OUTPATIENT_CLINIC_OR_DEPARTMENT_OTHER): Payer: BLUE CROSS/BLUE SHIELD | Admitting: Obstetrics

## 2018-01-09 ENCOUNTER — Encounter: Payer: Self-pay | Admitting: Obstetrics

## 2018-01-09 VITALS — BP 167/50 | HR 62 | Temp 98.4°F | Resp 20 | Ht 66.0 in | Wt 175.4 lb

## 2018-01-09 DIAGNOSIS — Z90710 Acquired absence of both cervix and uterus: Secondary | ICD-10-CM

## 2018-01-09 DIAGNOSIS — Z90722 Acquired absence of ovaries, bilateral: Secondary | ICD-10-CM

## 2018-01-09 DIAGNOSIS — C541 Malignant neoplasm of endometrium: Secondary | ICD-10-CM

## 2018-01-09 NOTE — Patient Instructions (Signed)
1. Activity restrictions as discussed 2. Return in ~one month for a vaginal cuff check

## 2018-01-10 NOTE — Progress Notes (Signed)
S: Early postop visit. Doing well. Never took pain meds. Normal bowel and bladder function. No fevers. 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 Lymph node, sentinel, biopsy, left obturator - NO CARCINOMA IDENTIFIED IN ONE LYMPH NODE (0/1) - SEE COMMENT 3. Lymph node, sentinel, biopsy, left external iliac - NO CARCINOMA IDENTIFIED IN ONE LYMPH NODE (0/1) - SEE COMMENT 4. Lymph node, sentinel, biopsy, right obturator - NO LYMPHOID TISSUE IDENTIFIED 5. Lymph node, sentinel, biopsy, right external iliac - NO CARCINOMA IDENTIFIED IN ONE LYMPH NODE (0/1) - SEE COMMENT Microscopic Comment 1. UTERUS, CARCINOMA OR CARCINOSARCOMA Procedure: Hysterectomy, bilateral salpingo-oophorectomy and sentinel lymph node biopsies Histologic type: Endometrioid carcinoma, not otherwise specified Histologic Grade: FIGO Grade I Myometrial invasion: Depth of invasion: 3 mm Myometrial thickness: 15 mm Uterine Serosa Involvement: Not identified Cervical stromal involvement: Not identified Extent of involvement of other organs: Not identified; see comment Lymphovascular invasion: Not identified COMMENT: There is a focus of intraluminal carcinoma identified within the left fallopian tube; deeper levels wereexamined and this does not connect to the fallopian tube epithelium and is therefore not staged as fallopian tubeinvolvement. Dr. Lyndon Code reviewed select parts of the case andagrees with the above diagnosis. Washings were negative. 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  Respiratory:  Respirations unlabored, no use of accessory muscles CV:   Deferred Breast:  Deferred Musculoskeletal: Normal muscle strength. Abdomen:  Trocar sites CDI No visible masses or protrusion Extremities:  No visible edema or deformities Skin:   Normal inspection Neuro/Psych:  No focal motor deficit, no abnormal mental status. Normal gait. Normal affect. Alert and oriented to person, place, and time   A/P 1. Activity restrictions reinforced 2. RTC ~one month for vaginal cuff check 3. No further treatment based on low risk factors 4. MMR was negative; family history not indicative of hereditary pattern. If [employer] still available to Medicare patients in the new year we can offer counseling visit.  Cc: DNat Math MD (Referring Ob/Gyn) SMonico Blitz MD (PCP)

## 2018-01-17 ENCOUNTER — Telehealth (HOSPITAL_COMMUNITY): Payer: Self-pay | Admitting: *Deleted

## 2018-01-17 NOTE — Telephone Encounter (Signed)
Patient called to inform that she has started the Sertraline

## 2018-02-11 ENCOUNTER — Encounter: Payer: Self-pay | Admitting: Obstetrics

## 2018-02-11 ENCOUNTER — Inpatient Hospital Stay: Payer: Medicare Other | Attending: Obstetrics | Admitting: Obstetrics

## 2018-02-11 VITALS — BP 156/61 | HR 60 | Temp 98.3°F | Resp 20 | Ht 66.0 in | Wt 176.5 lb

## 2018-02-11 DIAGNOSIS — Z90710 Acquired absence of both cervix and uterus: Secondary | ICD-10-CM | POA: Insufficient documentation

## 2018-02-11 DIAGNOSIS — Z90722 Acquired absence of ovaries, bilateral: Secondary | ICD-10-CM | POA: Insufficient documentation

## 2018-02-11 DIAGNOSIS — C541 Malignant neoplasm of endometrium: Secondary | ICD-10-CM | POA: Insufficient documentation

## 2018-02-11 NOTE — Patient Instructions (Signed)
1. Activity restrictions as discussed 2. Return in 3 months for pelvic

## 2018-02-11 NOTE — Progress Notes (Signed)
S: Vaginal cuff check / postop visit. Doing well. Never took pain meds. Normal bowel and bladder function. No fevers. 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 Lymph node, sentinel, biopsy, left obturator - NO CARCINOMA IDENTIFIED IN ONE LYMPH NODE (0/1) - SEE COMMENT 3. Lymph node, sentinel, biopsy, left external iliac - NO CARCINOMA IDENTIFIED IN ONE LYMPH NODE (0/1) - SEE COMMENT 4. Lymph node, sentinel, biopsy, right obturator - NO LYMPHOID TISSUE IDENTIFIED 5. Lymph node, sentinel, biopsy, right external iliac - NO CARCINOMA IDENTIFIED IN ONE LYMPH NODE (0/1) - SEE COMMENT Microscopic Comment 1. UTERUS, CARCINOMA OR CARCINOSARCOMA Procedure: Hysterectomy, bilateral salpingo-oophorectomy and sentinel lymph node biopsies Histologic type: Endometrioid carcinoma, not otherwise specified Histologic Grade: FIGO Grade I Myometrial invasion: Depth of invasion: 3 mm Myometrial thickness: 15 mm Uterine Serosa Involvement: Not identified Cervical stromal involvement: Not identified Extent of involvement of other organs: Not identified; see comment Lymphovascular invasion: Not identified COMMENT: There is a focus of intraluminal carcinoma identified within the left fallopian tube; deeper levels wereexamined and this does not connect to the fallopian tube epithelium and is therefore not staged as fallopian tubeinvolvement. Dr. Lyndon Code reviewed select parts of the case andagrees with the above diagnosis. Washings were negative. 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  Respiratory:  Respirations unlabored, no use of accessory muscles CV:   Deferred Breast:  Deferred Musculoskeletal: Normal muscle strength. Abdomen:  Trocar sites CDI No visible masses or protrusion Extremities:  No visible edema or deformities Skin:   Normal inspection Neuro/Psych:  No focal motor deficit, no abnormal mental status. Normal gait. Normal affect. Alert and oriented to person, place, and time  Vaginal cuff intact, no lesions of concern.   A/P 1. Activity restrictions reinforced 2. RTC 3 months for surveillance 3. No further treatment based on low risk factors 4. MMR was negative; family history not indicative of hereditary pattern. If [employer] still available to Medicare patients in the new year we can offer counseling visit.  Cc: DNat Math MD (Referring Ob/Gyn) SMonico Blitz MD (PCP)

## 2018-03-14 DIAGNOSIS — Z6831 Body mass index (BMI) 31.0-31.9, adult: Secondary | ICD-10-CM | POA: Diagnosis not present

## 2018-03-14 DIAGNOSIS — Z789 Other specified health status: Secondary | ICD-10-CM | POA: Diagnosis not present

## 2018-03-14 DIAGNOSIS — E7800 Pure hypercholesterolemia, unspecified: Secondary | ICD-10-CM | POA: Diagnosis not present

## 2018-03-14 DIAGNOSIS — I10 Essential (primary) hypertension: Secondary | ICD-10-CM | POA: Diagnosis not present

## 2018-03-14 DIAGNOSIS — Z299 Encounter for prophylactic measures, unspecified: Secondary | ICD-10-CM | POA: Diagnosis not present

## 2018-04-03 ENCOUNTER — Telehealth: Payer: Self-pay | Admitting: *Deleted

## 2018-04-03 NOTE — Telephone Encounter (Signed)
Attempted to call the patient to cancel her appt for 4/3, due to Dr. Gerarda Fraction leaving the practice. No answer, need to move the patient to see Dr. Fermin Schwab

## 2018-04-04 ENCOUNTER — Telehealth: Payer: Self-pay | Admitting: *Deleted

## 2018-04-04 NOTE — Telephone Encounter (Signed)
Called and spoke with the patient reagrding her appt for 4/3. Moved the patient to see Clarke-Pearson on 4/10

## 2018-05-09 ENCOUNTER — Telehealth: Payer: Self-pay | Admitting: *Deleted

## 2018-05-09 NOTE — Telephone Encounter (Signed)
Called and spoke with the patient regarding the hospital policy to move routine appts out several weeks. Appt moved from 4/14 to 5/12. Explained that Whitney Vaughan may receive call the day before to be pre-screened, and asked again at the front desk the day of the appt. Also explained the no visitor policy.

## 2018-05-17 ENCOUNTER — Ambulatory Visit: Payer: Self-pay | Admitting: Obstetrics

## 2018-05-28 ENCOUNTER — Ambulatory Visit: Payer: Self-pay | Admitting: Gynecology

## 2018-06-13 ENCOUNTER — Telehealth: Payer: Self-pay | Admitting: *Deleted

## 2018-06-13 NOTE — Telephone Encounter (Signed)
Called and spoke with the patient regarding her appt for 5/12. Patient chose to change app to a phone visit

## 2018-06-25 ENCOUNTER — Encounter: Payer: Self-pay | Admitting: Gynecology

## 2018-06-25 ENCOUNTER — Inpatient Hospital Stay: Payer: Medicare Other | Attending: Obstetrics | Admitting: Gynecology

## 2018-06-25 ENCOUNTER — Telehealth: Payer: Self-pay | Admitting: Gynecology

## 2018-06-25 DIAGNOSIS — Z90710 Acquired absence of both cervix and uterus: Secondary | ICD-10-CM | POA: Diagnosis not present

## 2018-06-25 DIAGNOSIS — C541 Malignant neoplasm of endometrium: Secondary | ICD-10-CM | POA: Diagnosis not present

## 2018-06-25 DIAGNOSIS — Z90722 Acquired absence of ovaries, bilateral: Secondary | ICD-10-CM | POA: Diagnosis not present

## 2018-06-25 NOTE — Patient Instructions (Signed)
Return visit in 3 months

## 2018-06-25 NOTE — Progress Notes (Signed)
.  [email or web address] Virtual Visit via Telephone Note  I connected with Whitney Vaughan on 06/25/18 at 10:15 AM EDT by telephone and verified that I am speaking with the correct person using two identifiers.  Location: Patient: home Provider: home in [place]   I discussed the limitations, risks, security and privacy concerns of performing an evaluation and management service by telephone and the availability of in person appointments. I also discussed with the patient that there may be a patient responsible charge related to this service. The patient expressed understanding and agreed to proceed.   History of Present Illness:    Observations/Objective:   Assessment and Plan:   Follow Up Instructions:    I discussed the assessment and treatment plan with the patient. The patient was provided an opportunity to ask questions and all were answered. The patient agreed with the plan and demonstrated an understanding of the instructions.   The patient was advised to call back or seek an in-person evaluation if the symptoms worsen or if the condition fails to improve as anticipated.  I provided 20 minutes of non-face-to-face time during this encounter.   Marti Sleigh, MD

## 2018-06-25 NOTE — Telephone Encounter (Signed)
See phone visit from today 

## 2018-06-25 NOTE — Progress Notes (Signed)
Consult Note: Gyn-Onc   Whitney Vaughan 67 y.o. female  Chief Complaint  Patient presents with  . endometrial cancer follow up    Assessment :  Stage Ia G1 endometrial cancer 2019.  Clinically NED  (10 minute phone visit and 10 minutes prep time)  Plan:  Signs of recurrent disease reviewed.  RTC 3 months  Interval History:   Phone visit (COVID) with patient.  She is doing well and denies any GI, GU, or pelvic symptoms.  She is staying home and working in her garden.    HPI:  Stage Ia G1 endometrial cancer underwent laparoscopic/robotic hyst, BSO and sentinel nodes on 12/27/17.   Very favorable pathology and no adjunct therapy needed.  Review of Systems:10 point review of systems is negative except as noted in interval history.   Vitals: There were no vitals taken for this visit.  Physical Exam:  Not performed,   Phone visit     No Known Allergies  Past Medical History:  Diagnosis Date  . Anesthesia    Difficulty waking up after surgery  . Anxiety   . Arthritis   . Complication of anesthesia    slow to wake up with biopsy on 12/06/2017   . Depression   . Family history of adverse reaction to anesthesia    mother- slow to wake up   . GERD (gastroesophageal reflux disease)   . Hemorrhoids   . Hyperlipidemia   . Hypertension   . 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 Peripheral vascular disease (HCC)    legs  . Varicose veins     Past Surgical History:  Procedure Laterality Date  . COLONOSCOPY  2005  . ENDOMETRIAL BIOPSY  11/07/2017  . MOUTH SURGERY     [place] [facility], about 32yrs ago  . ROBOTIC ASSISTED TOTAL HYSTERECTOMY WITH BILATERAL SALPINGO OOPHERECTOMY Bilateral 12/27/2017   Procedure: XI ROBOTIC ASSISTED TOTAL HYSTERECTOMY WITH BILATERAL SALPINGO OOPHORECTOMY,  STAGING;  Surgeon: Isabel Caprice, MD;  Location: [facility];  Service: Gynecology;  Laterality: Bilateral;  . SENTINEL NODE BIOPSY N/A 12/27/2017   Procedure: SENTINEL NODE  BIOPSY;  Surgeon: Isabel Caprice, MD;  Location: [facility];  Service: Gynecology;  Laterality: N/A;    Current Outpatient Medications  Medication Sig Dispense Refill  . acetaminophen (TYLENOL 8 HOUR ARTHRITIS PAIN) 650 MG CR tablet Take 650 mg by mouth at bedtime.    Marland Kitchen aspirin EC 81 MG tablet Take 1 tablet (81 mg total) by mouth every evening. Begin taking 3 days after surgery    . Calcium Carb-Cholecalciferol (CALTRATE 600+D3 PO) Take 1 tablet by mouth daily.    Marland Kitchen lisinopril (PRINIVIL,ZESTRIL) 20 MG tablet Take 20 mg by mouth daily.  2  . Multiple Vitamins-Minerals (PRESERVISION AREDS 2 PO) Take 1 tablet by mouth 2 (two) times daily.    . pravastatin (PRAVACHOL) 10 MG tablet Take 10 mg by mouth daily after supper.     . senna-docusate (SENOKOT-S) 8.6-50 MG tablet Take 1 tablet by mouth at bedtime. Stop if having loose stools 30 tablet 1  . sertraline (ZOLOFT) 25 MG tablet Take 1 tablet (25 mg total) by mouth daily. 90 tablet 0  . traMADol (ULTRAM) 50 MG tablet Take 1 tablet (50 mg total) by mouth every 6 (six) hours as needed for severe pain. (Patient not taking: Reported on 02/11/2018) 10 tablet 0   No current facility-administered medications for this visit.     Social History  Socioeconomic History  . Marital status: Single    Spouse name: Not on file  . Number of children: Not on file  . Years of education: Not on file  . Highest education level: Not on file  Occupational History  . Not on file  Social Needs  . Financial resource strain: Not on file  . Food insecurity:    Worry: Not on file    Inability: Not on file  . Transportation needs:    Medical: Not on file    Non-medical: Not on file  Tobacco Use  . Smoking status: Never Smoker  . Smokeless tobacco: Never Used  Substance and Sexual Activity  . Alcohol use: No  . Drug use: No  . Sexual activity: Not on file  Lifestyle  . Physical activity:    Days per week: Not on file    Minutes per session: Not on file  .  Stress: Not on file  Relationships  . Social connections:    Talks on phone: Not on file    Gets together: Not on file    Attends religious service: Not on file    Active member of club or organization: Not on file    Attends meetings of clubs or organizations: Not on file    Relationship status: Not on file  . Intimate partner violence:    Fear of current or ex partner: Not on file    Emotionally abused: Not on file    Physically abused: Not on file    Forced sexual activity: Not on file  Other Topics Concern  . Not on file  Social History Narrative  . Not on file    Family History  Problem Relation Age of Onset  . Deep vein thrombosis Father   . Heart disease Father   . Hyperlipidemia Father   . Varicose Veins Father   . Heart attack Father   . Heart disease Mother   . Hypertension Mother   . Heart attack Mother   . Heart failure Mother   . Asthma Mother   . Heart disease Sister        before age 58  . Hypertension Sister   . Heart attack Sister   . Kidney failure Sister   . Lung cancer Maternal Uncle   . Heart attack Paternal Grandfather   . Cancer Cousin        unsure. Diagosed by a urologist "starts with a c" and a type that women usually get      Marti Sleigh, MD 06/25/2018, 10:29 AM

## 2018-06-26 NOTE — Progress Notes (Signed)
I connected with  Whitney Vaughan on 06/26/18 by a video enabled telemedicine application and verified that I am speaking with the correct person using two identifiers.   I discussed the limitations of evaluation and management by telemedicine. The patient expressed understanding and agreed to proceed.

## 2018-08-21 ENCOUNTER — Telehealth: Payer: Self-pay | Admitting: *Deleted

## 2018-08-21 NOTE — Telephone Encounter (Signed)
Called and spoke with the patient regarding her appt for September. Scheduled an appt with Dr. Fermin Schwab

## 2018-09-17 DIAGNOSIS — E7800 Pure hypercholesterolemia, unspecified: Secondary | ICD-10-CM | POA: Diagnosis not present

## 2018-09-17 DIAGNOSIS — Z299 Encounter for prophylactic measures, unspecified: Secondary | ICD-10-CM | POA: Diagnosis not present

## 2018-09-17 DIAGNOSIS — Z0000 Encounter for general adult medical examination without abnormal findings: Secondary | ICD-10-CM | POA: Diagnosis not present

## 2018-09-17 DIAGNOSIS — Z79899 Other long term (current) drug therapy: Secondary | ICD-10-CM | POA: Diagnosis not present

## 2018-09-17 DIAGNOSIS — R5383 Other fatigue: Secondary | ICD-10-CM | POA: Diagnosis not present

## 2018-09-17 DIAGNOSIS — E559 Vitamin D deficiency, unspecified: Secondary | ICD-10-CM | POA: Diagnosis not present

## 2018-09-17 DIAGNOSIS — Z1331 Encounter for screening for depression: Secondary | ICD-10-CM | POA: Diagnosis not present

## 2018-09-17 DIAGNOSIS — Z6831 Body mass index (BMI) 31.0-31.9, adult: Secondary | ICD-10-CM | POA: Diagnosis not present

## 2018-09-17 DIAGNOSIS — Z7189 Other specified counseling: Secondary | ICD-10-CM | POA: Diagnosis not present

## 2018-09-17 DIAGNOSIS — Z1211 Encounter for screening for malignant neoplasm of colon: Secondary | ICD-10-CM | POA: Diagnosis not present

## 2018-09-17 DIAGNOSIS — I10 Essential (primary) hypertension: Secondary | ICD-10-CM | POA: Diagnosis not present

## 2018-09-17 DIAGNOSIS — Z1339 Encounter for screening examination for other mental health and behavioral disorders: Secondary | ICD-10-CM | POA: Diagnosis not present

## 2018-09-24 DIAGNOSIS — E2839 Other primary ovarian failure: Secondary | ICD-10-CM | POA: Diagnosis not present

## 2018-10-29 DIAGNOSIS — Z23 Encounter for immunization: Secondary | ICD-10-CM | POA: Diagnosis not present

## 2018-11-06 ENCOUNTER — Telehealth: Payer: Self-pay | Admitting: *Deleted

## 2018-11-06 NOTE — Telephone Encounter (Signed)
Attempted to cancel appt for 9/30, no answer and unable to leave ,message

## 2018-11-11 ENCOUNTER — Telehealth: Payer: Self-pay | Admitting: *Deleted

## 2018-11-11 NOTE — Telephone Encounter (Signed)
Called the patient and cancelled her appt for 9/30. Explained that We will call her back with a new appt

## 2018-11-13 ENCOUNTER — Inpatient Hospital Stay: Payer: Medicare Other | Admitting: Gynecology

## 2018-12-13 ENCOUNTER — Telehealth: Payer: Self-pay | Admitting: *Deleted

## 2018-12-13 NOTE — Telephone Encounter (Signed)
Called patient and scheduled a follow up appt for 12/2. Explained the policy for no visitors, mask and parking

## 2018-12-23 DIAGNOSIS — Z6831 Body mass index (BMI) 31.0-31.9, adult: Secondary | ICD-10-CM | POA: Diagnosis not present

## 2018-12-23 DIAGNOSIS — I10 Essential (primary) hypertension: Secondary | ICD-10-CM | POA: Diagnosis not present

## 2018-12-23 DIAGNOSIS — M25562 Pain in left knee: Secondary | ICD-10-CM | POA: Diagnosis not present

## 2018-12-23 DIAGNOSIS — M25569 Pain in unspecified knee: Secondary | ICD-10-CM | POA: Diagnosis not present

## 2018-12-23 DIAGNOSIS — F329 Major depressive disorder, single episode, unspecified: Secondary | ICD-10-CM | POA: Diagnosis not present

## 2018-12-23 DIAGNOSIS — Z299 Encounter for prophylactic measures, unspecified: Secondary | ICD-10-CM | POA: Diagnosis not present

## 2019-01-14 NOTE — Progress Notes (Signed)
Consult Note: Gyn-Onc  Whitney Vaughan 68 y.o. female  CC: No chief complaint on file.   HPI: Prior to her presentation, she reported vaginal bleeding for about a year.  At that time she was caring for a critically ill friend. After her obligation to the ill friend she followed up with her primary care provider about the bleeding.  She was promptly referred on to GYN, Dr. Adah Perl.  A transvaginal ultrasound was performed revealing a uterus 6.9 x 3.3 x 4.3 cm this was performed at [facility] rocking him.  The endometrial stripe is noted to be 18 cm?  I suspect a typo.  An endometrial biopsy was obtained revealing atypical endometrium.  The patient was then scheduled for hysteroscopic [facility] 12/06/2017.  This revealed grade 1 endometrioid adenocarcinoma.  12/27/17 Operation:  1. Robotic-assisted laparoscopic total hysterectomy with bilateral salpingoophorectomy 2. SLN biopsy 3. Pelvic washings 4. Right pelvic lymph sampling  Diagnosis 1. Uterus +/- tubes/ovaries, neoplastic, cervix, bilateral tubes and ovaries UTERUS: - ENDOMETRIOID CARCINOMA, FIGO GRADE I, ARISING IN AN ENDOMETRIAL POLYP WITH EXTENSION INTO THE MYOMETRIUM - LEIOMYOMATA (0.6 CM; LARGEST) - ADENOMYOSIS - SEE ONCOLOGY TABLE AND COMMENT BELOW CERVIX: - UNINVOLVED BY CARCINOMA BILATERAL OVARIES: - UNINVOLVED BY CARCINOMA BILATERAL FALLOPIAN TUBES: - UNINVOLVED BY CARCINOMA 2. Lymph node, sentinel, biopsy, left obturator - NO CARCINOMA IDENTIFIED IN ONE LYMPH NODE (0/1) - SEE COMMENT 3. Lymph node, sentinel, biopsy, left external iliac - NO CARCINOMA IDENTIFIED IN ONE LYMPH NODE (0/1) - SEE COMMENT 4. Lymph node, sentinel, biopsy, right obturator - NO LYMPHOID TISSUE IDENTIFIED 5. Lymph node, sentinel, biopsy, right external iliac - NO CARCINOMA IDENTIFIED IN ONE LYMPH NODE (0/1) - SEE COMMENT  Interval History:  She was seen by Dr. Fermin Schwab for a phone visit 5/20 and comes in today for an exam.  She is overall doing  very well and really has no complaints whatsoever.  She does occasionally have some nausea in the morning if she eats a cracker and drink some ginger ale it gets better it is pretty fleeting and that is nothing new.  She is trying to lose weight but that is been intentional.  She is not sure if her depression is coming back a little bit.  She states it is most likely secondary to the quarantine.  Previously she was going to the senior center several times a week and now she is only going 1 day a week.  She was also previously walking about a mile a day she is not currently doing that again because she thinks she is a little bit more depressed secondary to the isolation due to the pandemic.  She has not been diagnosed with any new medical problems.  She herself is physically feeling well.  She had a good holiday.  There are no new medical problems in her family.  Review of Systems  Constitutional:  Denies fever. Skin: No rash Cardiovascular: No chest pain, shortness of breath, or edema  Pulmonary: No cough Gastro Intestinal: Nausea as above, - vomiting, - constipation,- diarrhea reported.  Genitourinary: Denies vaginal bleeding and discharge.  Musculoskeletal: No joint pain.  Psychology: As above  Current Meds:  Outpatient Encounter Medications as of 01/15/2019  Medication Sig  . acetaminophen (TYLENOL 8 HOUR ARTHRITIS PAIN) 650 MG CR tablet Take 650 mg by mouth at bedtime.  Marland Kitchen aspirin EC 81 MG tablet Take 1 tablet (81 mg total) by mouth every evening. Begin taking 3 days after surgery  . Calcium Carb-Cholecalciferol (CALTRATE  600+D3 PO) Take 1 tablet by mouth daily.  Marland Kitchen lisinopril (PRINIVIL,ZESTRIL) 20 MG tablet Take 20 mg by mouth daily.  . Multiple Vitamins-Minerals (PRESERVISION AREDS 2 PO) Take 1 tablet by mouth 2 (two) times daily.  . pravastatin (PRAVACHOL) 10 MG tablet Take 10 mg by mouth daily after supper.   . senna-docusate (SENOKOT-S) 8.6-50 MG tablet Take 1 tablet by mouth at bedtime.  Stop if having loose stools  . sertraline (ZOLOFT) 25 MG tablet Take 1 tablet (25 mg total) by mouth daily.  . traMADol (ULTRAM) 50 MG tablet Take 1 tablet (50 mg total) by mouth every 6 (six) hours as needed for severe pain. (Patient not taking: Reported on 02/11/2018)   No facility-administered encounter medications on file as of 01/15/2019.     Allergy: No Known Allergies  Social Hx:   Social History   Socioeconomic History  . Marital status: Single    Spouse name: Not on file  . Number of children: Not on file  . Years of education: Not on file  . Highest education level: Not on file  Occupational History  . Not on file  Social Needs  . Financial resource strain: Not on file  . Food insecurity    Worry: Not on file    Inability: Not on file  . Transportation needs    Medical: Not on file    Non-medical: Not on file  Tobacco Use  . Smoking status: Never Smoker  . Smokeless tobacco: Never Used  Substance and Sexual Activity  . Alcohol use: No  . Drug use: No  . Sexual activity: Not on file  Lifestyle  . Physical activity    Days per week: Not on file    Minutes per session: Not on file  . Stress: Not on file  Relationships  . Social Herbalist on phone: Not on file    Gets together: Not on file    Attends religious service: Not on file    Active member of club or organization: Not on file    Attends meetings of clubs or organizations: Not on file    Relationship status: Not on file  . Intimate partner violence    Fear of current or ex partner: Not on file    Emotionally abused: Not on file    Physically abused: Not on file    Forced sexual activity: Not on file  Other Topics Concern  . Not on file  Social History Narrative  . Not on file    Past Surgical Hx:  Past Surgical History:  Procedure Laterality Date  . COLONOSCOPY  2005  . ENDOMETRIAL BIOPSY  11/07/2017  . MOUTH SURGERY     [facility] Teeth, about 23yrs ago  . ROBOTIC ASSISTED TOTAL  HYSTERECTOMY WITH BILATERAL SALPINGO OOPHERECTOMY Bilateral 12/27/2017   Procedure: XI ROBOTIC ASSISTED TOTAL HYSTERECTOMY WITH BILATERAL SALPINGO OOPHORECTOMY,  STAGING;  Surgeon: Whitney Caprice, MD;  Location: [facility];  Service: Gynecology;  Laterality: Bilateral;  . SENTINEL NODE BIOPSY N/A 12/27/2017   Procedure: SENTINEL NODE BIOPSY;  Surgeon: Whitney Caprice, MD;  Location: [facility];  Service: Gynecology;  Laterality: N/A;    Past Medical Hx:  Past Medical History:  Diagnosis Date  . Anesthesia    Difficulty waking up after surgery  . Anxiety   . Arthritis   . Complication of anesthesia    slow to wake up with biopsy on 12/06/2017   . Depression   . Family history  of adverse reaction to anesthesia    mother- slow to wake up   . GERD (gastroesophageal reflux disease)   . Hemorrhoids   . Hyperlipidemia   . Hypertension   . 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 Peripheral vascular disease (HCC)    legs  . Varicose veins     Oncology Hx:  Oncology History   No history exists.    Family Hx:  Family History  Problem Relation Age of Onset  . Deep vein thrombosis Father   . Heart disease Father   . Hyperlipidemia Father   . Varicose Veins Father   . Heart attack Father   . Heart disease Mother   . Hypertension Mother   . Heart attack Mother   . Heart failure Mother   . Asthma Mother   . Heart disease Sister        before age 80  . Hypertension Sister   . Heart attack Sister   . Kidney failure Sister   . Lung cancer Maternal Uncle   . Heart attack Paternal Grandfather   . Cancer Cousin        unsure. Diagosed by a urologist "starts with a c" and a type that women usually get    Vitals:  Blood pressure (!) 155/53, pulse 63, temperature 98.3 F (36.8 C), temperature source Temporal, resp. rate 17, height 5\' 6"  (1.676 m), weight 173 lb 12.8 oz (78.8 kg), SpO2 100 %.  Physical Exam: Well-nourished well-developed female in no acute  distress.  Neck: Supple, no lymphadenopathy, no thyromegaly.  Abdomen: Well-healed surgical incisions.  No evidence of any incisional hernias.  Abdomen is soft, nontender, nondistended.  There are no palpable masses.  There is no hepatosplenomegaly.  Groins: No lymphadenopathy.  Extremities: No edema.  Pelvic: External genitalia within normal limits for age.  The vagina is atrophic.  The vaginal cuff is visualized.  There are no visible lesions.  Bimanual examination reveals no masses or nodularity.  Rectal confirms.  Assessment/Plan: 68 year old with a stage IA Grade 1 endometrioid adenocarcinoma s/p definitive surgery 12/2017.  She has no evidence of recurrent disease and is doing very well.  She will return to see [place] in 6 months.  She knows to contact us if there is any issues prior to her next scheduled visit.  Avarae Zwart A., MD 01/15/2019, 10:19 AM

## 2019-01-15 ENCOUNTER — Inpatient Hospital Stay: Payer: Medicare Other | Attending: Gynecologic Oncology | Admitting: Gynecologic Oncology

## 2019-01-15 ENCOUNTER — Other Ambulatory Visit: Payer: Self-pay

## 2019-01-15 ENCOUNTER — Encounter: Payer: Self-pay | Admitting: Gynecologic Oncology

## 2019-01-15 VITALS — BP 155/53 | HR 63 | Temp 98.3°F | Resp 17 | Ht 66.0 in | Wt 173.8 lb

## 2019-01-15 DIAGNOSIS — I10 Essential (primary) hypertension: Secondary | ICD-10-CM | POA: Diagnosis not present

## 2019-01-15 DIAGNOSIS — Z90710 Acquired absence of both cervix and uterus: Secondary | ICD-10-CM | POA: Diagnosis not present

## 2019-01-15 DIAGNOSIS — Z7982 Long term (current) use of aspirin: Secondary | ICD-10-CM | POA: Diagnosis not present

## 2019-01-15 DIAGNOSIS — K219 Gastro-esophageal reflux disease without esophagitis: Secondary | ICD-10-CM | POA: Diagnosis not present

## 2019-01-15 DIAGNOSIS — Z79899 Other long term (current) drug therapy: Secondary | ICD-10-CM | POA: Diagnosis not present

## 2019-01-15 DIAGNOSIS — F329 Major depressive disorder, single episode, unspecified: Secondary | ICD-10-CM | POA: Insufficient documentation

## 2019-01-15 DIAGNOSIS — I739 Peripheral vascular disease, unspecified: Secondary | ICD-10-CM | POA: Diagnosis not present

## 2019-01-15 DIAGNOSIS — Z90722 Acquired absence of ovaries, bilateral: Secondary | ICD-10-CM | POA: Diagnosis not present

## 2019-01-15 DIAGNOSIS — C541 Malignant neoplasm of endometrium: Secondary | ICD-10-CM | POA: Insufficient documentation

## 2019-01-15 DIAGNOSIS — F419 Anxiety disorder, unspecified: Secondary | ICD-10-CM | POA: Insufficient documentation

## 2019-01-15 DIAGNOSIS — M1990 Unspecified osteoarthritis, unspecified site: Secondary | ICD-10-CM | POA: Diagnosis not present

## 2019-01-15 DIAGNOSIS — E785 Hyperlipidemia, unspecified: Secondary | ICD-10-CM | POA: Insufficient documentation

## 2019-01-15 NOTE — Patient Instructions (Signed)
It was nice to meet you today.  Please return to see [place] in 6 months.  Happy holidays and happy birthday.

## 2019-03-20 DIAGNOSIS — F329 Major depressive disorder, single episode, unspecified: Secondary | ICD-10-CM | POA: Diagnosis not present

## 2019-03-20 DIAGNOSIS — Z6831 Body mass index (BMI) 31.0-31.9, adult: Secondary | ICD-10-CM | POA: Diagnosis not present

## 2019-03-20 DIAGNOSIS — Z789 Other specified health status: Secondary | ICD-10-CM | POA: Diagnosis not present

## 2019-03-20 DIAGNOSIS — E7800 Pure hypercholesterolemia, unspecified: Secondary | ICD-10-CM | POA: Diagnosis not present

## 2019-03-20 DIAGNOSIS — I10 Essential (primary) hypertension: Secondary | ICD-10-CM | POA: Diagnosis not present

## 2019-03-20 DIAGNOSIS — Z299 Encounter for prophylactic measures, unspecified: Secondary | ICD-10-CM | POA: Diagnosis not present

## 2019-04-07 DIAGNOSIS — I10 Essential (primary) hypertension: Secondary | ICD-10-CM | POA: Diagnosis not present

## 2019-04-14 DIAGNOSIS — Z299 Encounter for prophylactic measures, unspecified: Secondary | ICD-10-CM | POA: Diagnosis not present

## 2019-04-14 DIAGNOSIS — M25569 Pain in unspecified knee: Secondary | ICD-10-CM | POA: Diagnosis not present

## 2019-04-14 DIAGNOSIS — I10 Essential (primary) hypertension: Secondary | ICD-10-CM | POA: Diagnosis not present

## 2019-04-14 DIAGNOSIS — Z6831 Body mass index (BMI) 31.0-31.9, adult: Secondary | ICD-10-CM | POA: Diagnosis not present

## 2019-04-14 DIAGNOSIS — M170 Bilateral primary osteoarthritis of knee: Secondary | ICD-10-CM | POA: Diagnosis not present

## 2019-04-17 DIAGNOSIS — Z23 Encounter for immunization: Secondary | ICD-10-CM | POA: Diagnosis not present

## 2019-04-21 DIAGNOSIS — M1711 Unilateral primary osteoarthritis, right knee: Secondary | ICD-10-CM | POA: Diagnosis not present

## 2019-04-25 DIAGNOSIS — M1712 Unilateral primary osteoarthritis, left knee: Secondary | ICD-10-CM | POA: Diagnosis not present

## 2019-04-28 DIAGNOSIS — M1711 Unilateral primary osteoarthritis, right knee: Secondary | ICD-10-CM | POA: Diagnosis not present

## 2019-05-01 DIAGNOSIS — M1712 Unilateral primary osteoarthritis, left knee: Secondary | ICD-10-CM | POA: Diagnosis not present

## 2019-05-05 DIAGNOSIS — M1711 Unilateral primary osteoarthritis, right knee: Secondary | ICD-10-CM | POA: Diagnosis not present

## 2019-05-09 DIAGNOSIS — M1712 Unilateral primary osteoarthritis, left knee: Secondary | ICD-10-CM | POA: Diagnosis not present

## 2019-05-12 DIAGNOSIS — M1711 Unilateral primary osteoarthritis, right knee: Secondary | ICD-10-CM | POA: Diagnosis not present

## 2019-05-13 DIAGNOSIS — I10 Essential (primary) hypertension: Secondary | ICD-10-CM | POA: Diagnosis not present

## 2019-05-15 DIAGNOSIS — M1712 Unilateral primary osteoarthritis, left knee: Secondary | ICD-10-CM | POA: Diagnosis not present

## 2019-05-19 DIAGNOSIS — M1711 Unilateral primary osteoarthritis, right knee: Secondary | ICD-10-CM | POA: Diagnosis not present

## 2019-05-23 DIAGNOSIS — Z23 Encounter for immunization: Secondary | ICD-10-CM | POA: Diagnosis not present

## 2019-05-23 DIAGNOSIS — M1712 Unilateral primary osteoarthritis, left knee: Secondary | ICD-10-CM | POA: Diagnosis not present

## 2019-05-26 DIAGNOSIS — M1711 Unilateral primary osteoarthritis, right knee: Secondary | ICD-10-CM | POA: Diagnosis not present

## 2019-05-29 DIAGNOSIS — M1712 Unilateral primary osteoarthritis, left knee: Secondary | ICD-10-CM | POA: Diagnosis not present

## 2019-06-13 DIAGNOSIS — I10 Essential (primary) hypertension: Secondary | ICD-10-CM | POA: Diagnosis not present

## 2019-06-25 DIAGNOSIS — M25562 Pain in left knee: Secondary | ICD-10-CM | POA: Diagnosis not present

## 2019-06-25 DIAGNOSIS — M25561 Pain in right knee: Secondary | ICD-10-CM | POA: Diagnosis not present

## 2019-06-27 DIAGNOSIS — M25561 Pain in right knee: Secondary | ICD-10-CM | POA: Diagnosis not present

## 2019-06-27 DIAGNOSIS — M25562 Pain in left knee: Secondary | ICD-10-CM | POA: Diagnosis not present

## 2019-06-30 DIAGNOSIS — M25562 Pain in left knee: Secondary | ICD-10-CM | POA: Diagnosis not present

## 2019-06-30 DIAGNOSIS — M25561 Pain in right knee: Secondary | ICD-10-CM | POA: Diagnosis not present

## 2019-06-30 DIAGNOSIS — G8929 Other chronic pain: Secondary | ICD-10-CM | POA: Diagnosis not present

## 2019-07-02 DIAGNOSIS — M25562 Pain in left knee: Secondary | ICD-10-CM | POA: Diagnosis not present

## 2019-07-02 DIAGNOSIS — M25561 Pain in right knee: Secondary | ICD-10-CM | POA: Diagnosis not present

## 2019-07-02 DIAGNOSIS — G8929 Other chronic pain: Secondary | ICD-10-CM | POA: Diagnosis not present

## 2019-07-07 DIAGNOSIS — G8929 Other chronic pain: Secondary | ICD-10-CM | POA: Diagnosis not present

## 2019-07-07 DIAGNOSIS — M25561 Pain in right knee: Secondary | ICD-10-CM | POA: Diagnosis not present

## 2019-07-07 DIAGNOSIS — M25562 Pain in left knee: Secondary | ICD-10-CM | POA: Diagnosis not present

## 2019-07-09 DIAGNOSIS — M25561 Pain in right knee: Secondary | ICD-10-CM | POA: Diagnosis not present

## 2019-07-09 DIAGNOSIS — M25562 Pain in left knee: Secondary | ICD-10-CM | POA: Diagnosis not present

## 2019-07-09 DIAGNOSIS — G8929 Other chronic pain: Secondary | ICD-10-CM | POA: Diagnosis not present

## 2019-07-13 DIAGNOSIS — I10 Essential (primary) hypertension: Secondary | ICD-10-CM | POA: Diagnosis not present

## 2019-07-15 DIAGNOSIS — M1710 Unilateral primary osteoarthritis, unspecified knee: Secondary | ICD-10-CM | POA: Diagnosis not present

## 2019-07-15 DIAGNOSIS — Z789 Other specified health status: Secondary | ICD-10-CM | POA: Diagnosis not present

## 2019-07-15 DIAGNOSIS — I10 Essential (primary) hypertension: Secondary | ICD-10-CM | POA: Diagnosis not present

## 2019-07-15 DIAGNOSIS — Z299 Encounter for prophylactic measures, unspecified: Secondary | ICD-10-CM | POA: Diagnosis not present

## 2019-07-18 DIAGNOSIS — M25561 Pain in right knee: Secondary | ICD-10-CM | POA: Diagnosis not present

## 2019-07-18 DIAGNOSIS — M25562 Pain in left knee: Secondary | ICD-10-CM | POA: Diagnosis not present

## 2019-07-18 DIAGNOSIS — G8929 Other chronic pain: Secondary | ICD-10-CM | POA: Diagnosis not present

## 2019-07-22 DIAGNOSIS — M25561 Pain in right knee: Secondary | ICD-10-CM | POA: Diagnosis not present

## 2019-07-22 DIAGNOSIS — G8929 Other chronic pain: Secondary | ICD-10-CM | POA: Diagnosis not present

## 2019-07-22 DIAGNOSIS — M25562 Pain in left knee: Secondary | ICD-10-CM | POA: Diagnosis not present

## 2019-07-25 DIAGNOSIS — M25561 Pain in right knee: Secondary | ICD-10-CM | POA: Diagnosis not present

## 2019-07-25 DIAGNOSIS — M25562 Pain in left knee: Secondary | ICD-10-CM | POA: Diagnosis not present

## 2019-07-25 DIAGNOSIS — G8929 Other chronic pain: Secondary | ICD-10-CM | POA: Diagnosis not present

## 2019-07-29 NOTE — Assessment & Plan Note (Signed)
Stage IA Grade 1 endometrioid adenocarcinoma  Negative symptom review, normal exam.  No evidence of recurrence  Return in 6 -  12 mths

## 2019-07-29 NOTE — Progress Notes (Signed)
Follow Up Note: Gyn-Onc  Whitney Vaughan 69 y.o. female  CC: She presents for a f/u visit  HPI:  Oncology History  Endometrial carcinoma ([place])  12/06/2017 Initial Diagnosis   Endometrial carcinoma (HCC) D&C-->grade 1 endometrioid adenocarcinoma   12/27/2017 Definitive Surgery   1. Robotic-assisted laparoscopic total hysterectomy with bilateral salpingoophorectomy 2. SLN biopsy 3. Pelvic washings 4. Right pelvic lymph sampling  Diagnosis 1. Uterus +/- tubes/ovaries, neoplastic, cervix, bilateral tubes and ovaries UTERUS: - ENDOMETRIOID CARCINOMA, FIGO GRADE I, ARISING IN AN ENDOMETRIAL POLYP WITH EXTENSION INTO THE MYOMETRIUM - LEIOMYOMATA (0.6 CM; LARGEST) - ADENOMYOSIS - SEE ONCOLOGY TABLE AND COMMENT BELOW CERVIX: - UNINVOLVED BY CARCINOMA BILATERAL OVARIES: - UNINVOLVED BY CARCINOMA BILATERAL FALLOPIAN TUBES: - UNINVOLVED BY CARCINOMA 2. Lymph node, sentinel, biopsy, left obturator - NO CARCINOMA IDENTIFIED IN ONE LYMPH NODE (0/1) - SEE COMMENT 3. Lymph node, sentinel, biopsy, left external iliac - NO CARCINOMA IDENTIFIED IN ONE LYMPH NODE (0/1) - SEE COMMENT 4. Lymph node, sentinel, biopsy, right obturator - NO LYMPHOID TISSUE IDENTIFIED 5. Lymph node, sentinel, biopsy, right external iliac - NO CARCINOMA IDENTIFIED IN ONE LYMPH NODE (0/1)   Endometrial cancer ([place])  12/27/2017 Initial Diagnosis   Endometrial cancer (HCC)     Interval History: She denies any vaginal bleeding, abdominal/pelvic pain, cough, lethargy or abdominal distention.  Her hemorrhoids are symptomatic.    Review of Systems Review of Systems  Constitutional: Negative for malaise/fatigue and weight loss.  Respiratory: Negative for cough.   Gastrointestinal: Negative for abdominal pain, nausea and vomiting.  Genitourinary:       No vaginal bleeding    Current Meds:  Outpatient Encounter Medications as of  07/30/2019  Medication Sig  . acetaminophen (TYLENOL 8 HOUR ARTHRITIS PAIN) 650 MG CR tablet Take 650 mg by mouth at bedtime.  Marland Kitchen aspirin EC 81 MG tablet Take 1 tablet (81 mg total) by mouth every evening. Begin taking 3 days after surgery  . Calcium Carb-Cholecalciferol (CALTRATE 600+D3 PO) Take 1 tablet by mouth daily.  Marland Kitchen lisinopril (PRINIVIL,ZESTRIL) 20 MG tablet Take 20 mg by mouth daily.  . Multiple Vitamins-Minerals (PRESERVISION AREDS 2 PO) Take 1 tablet by mouth 2 (two) times daily.  . pravastatin (PRAVACHOL) 10 MG tablet Take 10 mg by mouth daily after supper.   . senna-docusate (SENOKOT-S) 8.6-50 MG tablet Take 1 tablet by mouth at bedtime. Stop if having loose stools  . sertraline (ZOLOFT) 25 MG tablet Take 1 tablet (25 mg total) by mouth daily.  . traMADol (ULTRAM) 50 MG tablet Take 1 tablet (50 mg total) by mouth every 6 (six) hours as needed for severe pain. (Patient not taking: Reported on 02/11/2018)   No facility-administered encounter medications on file as of 07/30/2019.    Allergy: No Known Allergies  Social Hx:   Social History   Socioeconomic History  . Marital status: Single    Spouse name: Not on file  . Number of children: Not on file  . Years of education: Not on file  . Highest education level: Not on file  Occupational History  . Not on file  Tobacco Use  . Smoking status: Never Smoker  . Smokeless tobacco: Never Used  Vaping Use  . Vaping Use: Never used  Substance and Sexual Activity  . Alcohol use: No  . Drug use: No  . Sexual activity: Not on file  Other Topics Concern  . Not on file  Social History Narrative  . Not on file   Social Determinants  of Health   Financial Resource Strain:   . Difficulty of Paying Living Expenses:   Food Insecurity:   . Worried About Charity fundraiser in the Last Year:   . Arboriculturist in the Last Year:   Transportation Needs:   . Film/video editor (Medical):   Marland Kitchen Lack of Transportation (Non-Medical):    Physical Activity:   . Days of Exercise per Week:   . Minutes of Exercise per Session:   Stress:   . Feeling of Stress :   Social Connections:   . Frequency of Communication with Friends and Family:   . Frequency of Social Gatherings with Friends and Family:   . Attends Religious Services:   . Active Member of Clubs or Organizations:   . Attends Archivist Meetings:   Marland Kitchen Marital Status:   Intimate Partner Violence:   . Fear of Current or Ex-Partner:   . Emotionally Abused:   Marland Kitchen Physically Abused:   . Sexually Abused:     Past Surgical Hx:  Past Surgical History:  Procedure Laterality Date  . COLONOSCOPY  2005  . ENDOMETRIAL BIOPSY  11/07/2017  . MOUTH SURGERY     [facility] Teeth, about 41yrs ago  . ROBOTIC ASSISTED TOTAL HYSTERECTOMY WITH BILATERAL SALPINGO OOPHERECTOMY Bilateral 12/27/2017   Procedure: XI ROBOTIC ASSISTED TOTAL HYSTERECTOMY WITH BILATERAL SALPINGO OOPHORECTOMY,  STAGING;  Surgeon: Isabel Caprice, MD;  Location: [facility];  Service: Gynecology;  Laterality: Bilateral;  . SENTINEL NODE BIOPSY N/A 12/27/2017   Procedure: SENTINEL NODE BIOPSY;  Surgeon: Isabel Caprice, MD;  Location: [facility];  Service: Gynecology;  Laterality: N/A;    Past Medical Hx:  Past Medical History:  Diagnosis Date  . Anesthesia    Difficulty waking up after surgery  . Anxiety   . Arthritis   . Complication of anesthesia    slow to wake up with biopsy on 12/06/2017   . Depression   . Family history of adverse reaction to anesthesia    mother- slow to wake up   . GERD (gastroesophageal reflux disease)   . Hemorrhoids   . Hyperlipidemia   . Hypertension   . 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 Peripheral vascular disease (HCC)    legs  . Varicose veins     Family Hx:  Family History  Problem Relation Age of Onset  . Deep vein thrombosis Father   . Heart disease Father   . Hyperlipidemia Father   . Varicose Veins Father   . Heart attack  Father   . Heart disease Mother   . Hypertension Mother   . Heart attack Mother   . Heart failure Mother   . Asthma Mother   . Heart disease Sister        before age 67  . Hypertension Sister   . Heart attack Sister   . Kidney failure Sister   . Lung cancer Maternal Uncle   . Heart attack Paternal Grandfather   . Cancer Cousin        unsure. Diagosed by a urologist "starts with a c" and a type that women usually get    Vitals:  BP (!) 152/53 (BP Location: Left Arm, Patient Position: Sitting)   Pulse (!) 56   Temp 98.1 F (36.7 C) (Oral)   Resp 16   Ht 5\' 6"  (1.676 m)   Wt 176 lb 6 oz (80 kg)   SpO2  100%   BMI 28.47 kg/m  Physical Exam: Physical Exam Constitutional:      General: She is not in acute distress.    Appearance: Normal appearance.  Abdominal:     General: There is no distension.     Palpations: Abdomen is soft. There is no mass.     Tenderness: There is no abdominal tenderness.  Genitourinary:    General: Normal vulva.     Vagina: Normal.     Adnexa: Right adnexa normal and left adnexa normal.  Lymphadenopathy:     Upper Body:     Right upper body: No supraclavicular adenopathy.     Left upper body: No supraclavicular adenopathy.     Lower Body: No right inguinal adenopathy. No left inguinal adenopathy.  Neurological:     Mental Status: She is alert.     Assessment/Plan: Endometrial carcinoma (HCC) Stage IA Grade 1 endometrioid adenocarcinoma  Negative symptom review, normal exam.  No evidence of recurrence  Return in 6 -  12 mths   I personally spent 86minutes face-to-face and non-face-to-face in the care of this patient, which includes all pre, intra, and post visit time on the date of service.   Lahoma Crocker, MD 07/29/2019, 8:45 PM

## 2019-07-30 ENCOUNTER — Inpatient Hospital Stay: Payer: Medicare Other | Attending: Obstetrics & Gynecology | Admitting: Obstetrics & Gynecology

## 2019-07-30 ENCOUNTER — Other Ambulatory Visit: Payer: Self-pay

## 2019-07-30 ENCOUNTER — Encounter: Payer: Self-pay | Admitting: Obstetrics & Gynecology

## 2019-07-30 VITALS — BP 152/53 | HR 56 | Temp 98.1°F | Resp 16 | Ht 66.0 in | Wt 176.4 lb

## 2019-07-30 DIAGNOSIS — Z90710 Acquired absence of both cervix and uterus: Secondary | ICD-10-CM

## 2019-07-30 DIAGNOSIS — Z8542 Personal history of malignant neoplasm of other parts of uterus: Secondary | ICD-10-CM

## 2019-07-30 DIAGNOSIS — Z809 Family history of malignant neoplasm, unspecified: Secondary | ICD-10-CM | POA: Diagnosis not present

## 2019-07-30 DIAGNOSIS — Z90722 Acquired absence of ovaries, bilateral: Secondary | ICD-10-CM | POA: Diagnosis not present

## 2019-07-30 DIAGNOSIS — Z801 Family history of malignant neoplasm of trachea, bronchus and lung: Secondary | ICD-10-CM | POA: Diagnosis not present

## 2019-07-30 DIAGNOSIS — C541 Malignant neoplasm of endometrium: Secondary | ICD-10-CM

## 2019-07-30 NOTE — Patient Instructions (Signed)
Return in 6 months

## 2019-08-01 DIAGNOSIS — M25662 Stiffness of left knee, not elsewhere classified: Secondary | ICD-10-CM | POA: Diagnosis not present

## 2019-08-01 DIAGNOSIS — M25661 Stiffness of right knee, not elsewhere classified: Secondary | ICD-10-CM | POA: Diagnosis not present

## 2019-08-01 DIAGNOSIS — M25561 Pain in right knee: Secondary | ICD-10-CM | POA: Diagnosis not present

## 2019-08-01 DIAGNOSIS — G8929 Other chronic pain: Secondary | ICD-10-CM | POA: Diagnosis not present

## 2019-08-01 DIAGNOSIS — M25562 Pain in left knee: Secondary | ICD-10-CM | POA: Diagnosis not present

## 2019-08-13 DIAGNOSIS — I10 Essential (primary) hypertension: Secondary | ICD-10-CM | POA: Diagnosis not present

## 2019-09-03 DIAGNOSIS — H43813 Vitreous degeneration, bilateral: Secondary | ICD-10-CM | POA: Diagnosis not present

## 2019-09-12 DIAGNOSIS — I10 Essential (primary) hypertension: Secondary | ICD-10-CM | POA: Diagnosis not present

## 2019-09-14 HISTORY — PX: CATARACT EXTRACTION: SUR2

## 2019-09-19 DIAGNOSIS — E559 Vitamin D deficiency, unspecified: Secondary | ICD-10-CM | POA: Diagnosis not present

## 2019-09-19 DIAGNOSIS — Z1339 Encounter for screening examination for other mental health and behavioral disorders: Secondary | ICD-10-CM | POA: Diagnosis not present

## 2019-09-19 DIAGNOSIS — Z1331 Encounter for screening for depression: Secondary | ICD-10-CM | POA: Diagnosis not present

## 2019-09-19 DIAGNOSIS — R5383 Other fatigue: Secondary | ICD-10-CM | POA: Diagnosis not present

## 2019-09-19 DIAGNOSIS — Z7189 Other specified counseling: Secondary | ICD-10-CM | POA: Diagnosis not present

## 2019-09-19 DIAGNOSIS — Z0000 Encounter for general adult medical examination without abnormal findings: Secondary | ICD-10-CM | POA: Diagnosis not present

## 2019-09-19 DIAGNOSIS — Z79899 Other long term (current) drug therapy: Secondary | ICD-10-CM | POA: Diagnosis not present

## 2019-09-19 DIAGNOSIS — Z299 Encounter for prophylactic measures, unspecified: Secondary | ICD-10-CM | POA: Diagnosis not present

## 2019-09-19 DIAGNOSIS — Z6830 Body mass index (BMI) 30.0-30.9, adult: Secondary | ICD-10-CM | POA: Diagnosis not present

## 2019-09-19 DIAGNOSIS — E7800 Pure hypercholesterolemia, unspecified: Secondary | ICD-10-CM | POA: Diagnosis not present

## 2019-09-26 DIAGNOSIS — H2513 Age-related nuclear cataract, bilateral: Secondary | ICD-10-CM | POA: Diagnosis not present

## 2019-09-26 DIAGNOSIS — H353132 Nonexudative age-related macular degeneration, bilateral, intermediate dry stage: Secondary | ICD-10-CM | POA: Diagnosis not present

## 2019-09-26 DIAGNOSIS — H35371 Puckering of macula, right eye: Secondary | ICD-10-CM | POA: Diagnosis not present

## 2019-09-26 DIAGNOSIS — H2511 Age-related nuclear cataract, right eye: Secondary | ICD-10-CM | POA: Diagnosis not present

## 2019-09-26 DIAGNOSIS — H25013 Cortical age-related cataract, bilateral: Secondary | ICD-10-CM | POA: Diagnosis not present

## 2019-10-08 DIAGNOSIS — H25811 Combined forms of age-related cataract, right eye: Secondary | ICD-10-CM | POA: Diagnosis not present

## 2019-10-08 DIAGNOSIS — H2511 Age-related nuclear cataract, right eye: Secondary | ICD-10-CM | POA: Diagnosis not present

## 2019-10-14 DIAGNOSIS — H25012 Cortical age-related cataract, left eye: Secondary | ICD-10-CM | POA: Diagnosis not present

## 2019-10-14 DIAGNOSIS — H2512 Age-related nuclear cataract, left eye: Secondary | ICD-10-CM | POA: Diagnosis not present

## 2019-10-14 DIAGNOSIS — I10 Essential (primary) hypertension: Secondary | ICD-10-CM | POA: Diagnosis not present

## 2019-10-15 HISTORY — PX: CATARACT EXTRACTION: SUR2

## 2019-10-29 DIAGNOSIS — H25812 Combined forms of age-related cataract, left eye: Secondary | ICD-10-CM | POA: Diagnosis not present

## 2019-10-29 DIAGNOSIS — H2512 Age-related nuclear cataract, left eye: Secondary | ICD-10-CM | POA: Diagnosis not present

## 2019-10-29 DIAGNOSIS — H25012 Cortical age-related cataract, left eye: Secondary | ICD-10-CM | POA: Diagnosis not present

## 2019-10-29 DIAGNOSIS — H25811 Combined forms of age-related cataract, right eye: Secondary | ICD-10-CM | POA: Diagnosis not present

## 2019-11-06 DIAGNOSIS — H2512 Age-related nuclear cataract, left eye: Secondary | ICD-10-CM | POA: Diagnosis not present

## 2019-11-10 DIAGNOSIS — Z23 Encounter for immunization: Secondary | ICD-10-CM | POA: Diagnosis not present

## 2019-11-13 DIAGNOSIS — I10 Essential (primary) hypertension: Secondary | ICD-10-CM | POA: Diagnosis not present

## 2019-12-05 DIAGNOSIS — Z1231 Encounter for screening mammogram for malignant neoplasm of breast: Secondary | ICD-10-CM | POA: Diagnosis not present

## 2019-12-13 DIAGNOSIS — I10 Essential (primary) hypertension: Secondary | ICD-10-CM | POA: Diagnosis not present

## 2020-01-13 DIAGNOSIS — I10 Essential (primary) hypertension: Secondary | ICD-10-CM | POA: Diagnosis not present

## 2020-01-13 NOTE — Assessment & Plan Note (Signed)
Stage IA Grade 1 endometrioid adenocarcinoma Negative symptom review, normal exam.  No evidence of recurrence  >return in 1 yr; f/u w/a generalist is appropriate

## 2020-01-13 NOTE — Progress Notes (Signed)
Follow Up Note: Gyn-Onc  Whitney Vaughan 69 y.o. 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 Robotic-assisted laparoscopic total hysterectomy with bilateral salpingoophorectomy 2. SLN biopsy 3. Pelvic washings 4. Right pelvic lymph sampling  Diagnosis 1. Uterus +/- tubes/ovaries, neoplastic, cervix, bilateral tubes and ovaries UTERUS: - ENDOMETRIOID CARCINOMA, FIGO GRADE I, ARISING IN AN ENDOMETRIAL POLYP WITH EXTENSION INTO THE MYOMETRIUM - LEIOMYOMATA (0.6 CM; LARGEST) - ADENOMYOSIS - SEE ONCOLOGY TABLE AND COMMENT BELOW CERVIX: - UNINVOLVED BY CARCINOMA BILATERAL OVARIES: - UNINVOLVED BY CARCINOMA BILATERAL FALLOPIAN TUBES: - UNINVOLVED BY CARCINOMA 2. Lymph node, sentinel, biopsy, left obturator - NO CARCINOMA IDENTIFIED IN ONE LYMPH NODE (0/1) - SEE COMMENT 3. Lymph node, sentinel, biopsy, left external iliac - NO CARCINOMA IDENTIFIED IN ONE LYMPH NODE (0/1) - SEE COMMENT 4. Lymph node, sentinel, biopsy, right obturator - NO LYMPHOID TISSUE IDENTIFIED 5. Lymph node, sentinel, biopsy, right external iliac - NO CARCINOMA IDENTIFIED IN ONE LYMPH NODE (0/1)   Endometrial cancer ([place])  12/27/2017 Initial Diagnosis   Endometrial cancer (HCC)     Interval History: She denies any vaginal bleeding, abdominal/pelvic pain, cough, lethargy or abdominal distention.    Review of Systems Review of Systems  Constitutional: Negative for malaise/fatigue and weight loss.  Respiratory: Negative for cough.   Gastrointestinal: Negative for abdominal pain, nausea and vomiting.  Genitourinary:       No vaginal bleeding    Current Meds:  Outpatient Encounter Medications as of 01/14/2020  Medication Sig  .  acetaminophen (TYLENOL 8 HOUR ARTHRITIS PAIN) 650 MG CR tablet Take 650 mg by mouth at bedtime.  Marland Kitchen aspirin EC 81 MG tablet Take 1 tablet (81 mg total) by mouth every evening. Begin taking 3 days after surgery  . Calcium Carb-Cholecalciferol (CALTRATE 600+D3 PO) Take 1 tablet by mouth daily.  Marland Kitchen lisinopril (PRINIVIL,ZESTRIL) 20 MG tablet Take 20 mg by mouth daily.  . Multiple Vitamins-Minerals (PRESERVISION AREDS 2 PO) Take 1 tablet by mouth 2 (two) times daily.  . pravastatin (PRAVACHOL) 10 MG tablet Take 10 mg by mouth daily after supper.   . senna-docusate (SENOKOT-S) 8.6-50 MG tablet Take 1 tablet by mouth at bedtime. Stop if having loose stools  . sertraline (ZOLOFT) 25 MG tablet Take 1 tablet (25 mg total) by mouth daily.   No facility-administered encounter medications on file as of 01/14/2020.    Allergy: No Known Allergies  Social Hx:   Social History   Socioeconomic History  . Marital status: Single    Spouse name: Not on file  . Number of children: Not on file  . Years of education: Not on file  . Highest education level: Not on file  Occupational History  . Not on file  Tobacco Use  . Smoking status: Never Smoker  . Smokeless tobacco: Never Used  Vaping Use  . Vaping Use: Never used  Substance and Sexual Activity  . Alcohol use: No  . Drug use: No  . Sexual activity: Not on file  Other Topics Concern  . Not on file  Social History Narrative  . Not on file   Social Determinants of Health   Financial Resource Strain:   . Difficulty of Paying Living Expenses: Not on file  Food Insecurity:   . Worried About Charity fundraiser in the Last Year: Not  on file  . Ran Out of Food in the Last Year: Not on file  Transportation Needs:   . Lack of Transportation (Medical): Not on file  . Lack of Transportation (Non-Medical): Not on file  Physical Activity:   . Days of Exercise per Week: Not on file  . Minutes of Exercise per Session: Not on file  Stress:   . Feeling  of Stress : Not on file  Social Connections:   . Frequency of Communication with Friends and Family: Not on file  . Frequency of Social Gatherings with Friends and Family: Not on file  . Attends Religious Services: Not on file  . Active Member of Clubs or Organizations: Not on file  . Attends Archivist Meetings: Not on file  . Marital Status: Not on file  Intimate Partner Violence:   . Fear of Current or Ex-Partner: Not on file  . Emotionally Abused: Not on file  . Physically Abused: Not on file  . Sexually Abused: Not on file    Past Surgical Hx:  Past Surgical History:  Procedure Laterality Date  . CATARACT EXTRACTION Left 09/2019  . CATARACT EXTRACTION Right 10/2019  . COLONOSCOPY  2005  . ENDOMETRIAL BIOPSY  11/07/2017  . MOUTH SURGERY     [facility] Te[facility], about 32yrs ago  . ROBOTIC ASSISTED TOTAL HYSTERECTOMY WITH BILATERAL SALPINGO OOPHERECTOMY Bilateral 12/27/2017   Procedure: XI ROBOTIC ASSISTED TOTAL HYSTERECTOMY WITH BILATERAL SALPINGO OOPHORECTOMY,  STAGING;  Surgeon: Isabel Caprice, MD;  Location: [facility];  Service: Gynecology;  Laterality: Bilateral;  . SENTINEL NODE BIOPSY N/A 12/27/2017   Procedure: SENTINEL NODE BIOPSY;  Surgeon: Isabel Caprice, MD;  Location: [facility];  Service: Gynecology;  Laterality: N/A;    Past Medical Hx:  Past Medical History:  Diagnosis Date  . Anesthesia    Difficulty waking up after surgery  . Anxiety   . Arthritis   . Complication of anesthesia    slow to wake up with biopsy on 12/06/2017   . Depression   . Family history of adverse reaction to anesthesia    mother- slow to wake up   . GERD (gastroesophageal reflux disease)   . Hemorrhoids   . Hyperlipidemia   . Hypertension   . 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 Peripheral vascular disease (HCC)    legs  . Varicose veins     Family Hx:  Family History  Problem Relation Age of Onset  . Deep vein thrombosis Father   . Heart  disease Father   . Hyperlipidemia Father   . Varicose Veins Father   . Heart attack Father   . Heart disease Mother   . Hypertension Mother   . Heart attack Mother   . Heart failure Mother   . Asthma Mother   . Heart disease Sister        before age 77  . Hypertension Sister   . Heart attack Sister   . Kidney failure Sister   . Lung cancer Maternal Uncle   . Heart attack Paternal Grandfather   . Cancer Cousin        unsure. Diagosed by a urologist "starts with a c" and a type that women usually get    Vitals:  BP (!) 166/50 (BP Location: Left Arm, Patient Position: Sitting)   Pulse 63   Temp (!) 96.8 F (36 C) (Tympanic)   Resp 20   Ht 5\' 6"  (1.676  m)   Wt 176 lb (79.8 kg)   SpO2 100% Comment: RA  BMI 28.41 kg/m   Physical Exam: Physical Exam Constitutional:      General: She is not in acute distress.    Appearance: Normal appearance.  Abdominal:     General: There is no distension.     Palpations: Abdomen is soft. There is no mass.     Tenderness: There is no abdominal tenderness.  Genitourinary:    General: Normal vulva.     Vagina: Normal.     Adnexa: Right adnexa normal and left adnexa normal.  Lymphadenopathy:     Upper Body:     Right upper body: No supraclavicular adenopathy.     Left upper body: No supraclavicular adenopathy.     Lower Body: No right inguinal adenopathy. No left inguinal adenopathy.  Neurological:     Mental Status: She is alert.     Assessment/Plan: Endometrial carcinoma (HCC) Stage IA Grade 1 endometrioid adenocarcinoma Negative symptom review, normal exam.  No evidence of recurrence  >return in 1 yr; f/u w/a generalist is appropriate   I personally spent 25 minutes face-to-face and non-face-to-face in the care of this patient, which includes all pre, intra, and post visit time on the date of service.   Lahoma Crocker, MD 01/14/2020, 12:43 PM

## 2020-01-14 ENCOUNTER — Other Ambulatory Visit: Payer: Self-pay

## 2020-01-14 ENCOUNTER — Encounter: Payer: Self-pay | Admitting: Obstetrics & Gynecology

## 2020-01-14 ENCOUNTER — Inpatient Hospital Stay: Payer: Medicare Other | Attending: Obstetrics & Gynecology | Admitting: Obstetrics & Gynecology

## 2020-01-14 DIAGNOSIS — I10 Essential (primary) hypertension: Secondary | ICD-10-CM | POA: Insufficient documentation

## 2020-01-14 DIAGNOSIS — Z90710 Acquired absence of both cervix and uterus: Secondary | ICD-10-CM | POA: Diagnosis not present

## 2020-01-14 DIAGNOSIS — I739 Peripheral vascular disease, unspecified: Secondary | ICD-10-CM | POA: Diagnosis not present

## 2020-01-14 DIAGNOSIS — E785 Hyperlipidemia, unspecified: Secondary | ICD-10-CM | POA: Diagnosis not present

## 2020-01-14 DIAGNOSIS — Z90722 Acquired absence of ovaries, bilateral: Secondary | ICD-10-CM | POA: Diagnosis not present

## 2020-01-14 DIAGNOSIS — K219 Gastro-esophageal reflux disease without esophagitis: Secondary | ICD-10-CM | POA: Diagnosis not present

## 2020-01-14 DIAGNOSIS — Z7982 Long term (current) use of aspirin: Secondary | ICD-10-CM | POA: Insufficient documentation

## 2020-01-14 DIAGNOSIS — Z79899 Other long term (current) drug therapy: Secondary | ICD-10-CM | POA: Diagnosis not present

## 2020-01-14 DIAGNOSIS — C541 Malignant neoplasm of endometrium: Secondary | ICD-10-CM | POA: Insufficient documentation

## 2020-01-14 NOTE — Patient Instructions (Addendum)
Return in 1 year Call the office [phone]  in July of 2022  To schedule your appointment for December 2022 with Dr. Delsa Sale.

## 2020-01-27 DIAGNOSIS — Z23 Encounter for immunization: Secondary | ICD-10-CM | POA: Diagnosis not present

## 2020-02-12 DIAGNOSIS — I10 Essential (primary) hypertension: Secondary | ICD-10-CM | POA: Diagnosis not present

## 2020-03-15 DIAGNOSIS — I10 Essential (primary) hypertension: Secondary | ICD-10-CM | POA: Diagnosis not present

## 2020-03-23 DIAGNOSIS — Z789 Other specified health status: Secondary | ICD-10-CM | POA: Diagnosis not present

## 2020-03-23 DIAGNOSIS — I10 Essential (primary) hypertension: Secondary | ICD-10-CM | POA: Diagnosis not present

## 2020-03-23 DIAGNOSIS — E7800 Pure hypercholesterolemia, unspecified: Secondary | ICD-10-CM | POA: Diagnosis not present

## 2020-03-23 DIAGNOSIS — Z6830 Body mass index (BMI) 30.0-30.9, adult: Secondary | ICD-10-CM | POA: Diagnosis not present

## 2020-03-23 DIAGNOSIS — Z299 Encounter for prophylactic measures, unspecified: Secondary | ICD-10-CM | POA: Diagnosis not present

## 2020-04-12 DIAGNOSIS — I10 Essential (primary) hypertension: Secondary | ICD-10-CM | POA: Diagnosis not present

## 2020-05-13 DIAGNOSIS — I10 Essential (primary) hypertension: Secondary | ICD-10-CM | POA: Diagnosis not present

## 2020-05-24 DIAGNOSIS — H353132 Nonexudative age-related macular degeneration, bilateral, intermediate dry stage: Secondary | ICD-10-CM | POA: Diagnosis not present

## 2020-06-11 DIAGNOSIS — I10 Essential (primary) hypertension: Secondary | ICD-10-CM | POA: Diagnosis not present

## 2020-07-13 DIAGNOSIS — I10 Essential (primary) hypertension: Secondary | ICD-10-CM | POA: Diagnosis not present

## 2020-08-12 DIAGNOSIS — I10 Essential (primary) hypertension: Secondary | ICD-10-CM | POA: Diagnosis not present

## 2020-09-10 DIAGNOSIS — I10 Essential (primary) hypertension: Secondary | ICD-10-CM | POA: Diagnosis not present

## 2020-09-21 DIAGNOSIS — E8940 Asymptomatic postprocedural ovarian failure: Secondary | ICD-10-CM | POA: Diagnosis not present

## 2020-09-21 DIAGNOSIS — E7800 Pure hypercholesterolemia, unspecified: Secondary | ICD-10-CM | POA: Diagnosis not present

## 2020-09-21 DIAGNOSIS — Z6831 Body mass index (BMI) 31.0-31.9, adult: Secondary | ICD-10-CM | POA: Diagnosis not present

## 2020-09-21 DIAGNOSIS — Z299 Encounter for prophylactic measures, unspecified: Secondary | ICD-10-CM | POA: Diagnosis not present

## 2020-09-21 DIAGNOSIS — Z1331 Encounter for screening for depression: Secondary | ICD-10-CM | POA: Diagnosis not present

## 2020-09-21 DIAGNOSIS — R5383 Other fatigue: Secondary | ICD-10-CM | POA: Diagnosis not present

## 2020-09-21 DIAGNOSIS — Z7189 Other specified counseling: Secondary | ICD-10-CM | POA: Diagnosis not present

## 2020-09-21 DIAGNOSIS — Z79899 Other long term (current) drug therapy: Secondary | ICD-10-CM | POA: Diagnosis not present

## 2020-09-21 DIAGNOSIS — Z0000 Encounter for general adult medical examination without abnormal findings: Secondary | ICD-10-CM | POA: Diagnosis not present

## 2020-09-21 DIAGNOSIS — E559 Vitamin D deficiency, unspecified: Secondary | ICD-10-CM | POA: Diagnosis not present

## 2020-09-21 DIAGNOSIS — Z1339 Encounter for screening examination for other mental health and behavioral disorders: Secondary | ICD-10-CM | POA: Diagnosis not present

## 2020-10-01 DIAGNOSIS — E2839 Other primary ovarian failure: Secondary | ICD-10-CM | POA: Diagnosis not present

## 2020-10-07 DIAGNOSIS — H0100A Unspecified blepharitis right eye, upper and lower eyelids: Secondary | ICD-10-CM | POA: Diagnosis not present

## 2020-10-13 DIAGNOSIS — I10 Essential (primary) hypertension: Secondary | ICD-10-CM | POA: Diagnosis not present

## 2020-10-26 DIAGNOSIS — Z20828 Contact with and (suspected) exposure to other viral communicable diseases: Secondary | ICD-10-CM | POA: Diagnosis not present

## 2020-10-26 DIAGNOSIS — Z23 Encounter for immunization: Secondary | ICD-10-CM | POA: Diagnosis not present

## 2020-10-27 DIAGNOSIS — M9901 Segmental and somatic dysfunction of cervical region: Secondary | ICD-10-CM | POA: Diagnosis not present

## 2020-10-27 DIAGNOSIS — M5033 Other cervical disc degeneration, cervicothoracic region: Secondary | ICD-10-CM | POA: Diagnosis not present

## 2020-10-28 DIAGNOSIS — M5033 Other cervical disc degeneration, cervicothoracic region: Secondary | ICD-10-CM | POA: Diagnosis not present

## 2020-10-28 DIAGNOSIS — M9901 Segmental and somatic dysfunction of cervical region: Secondary | ICD-10-CM | POA: Diagnosis not present

## 2020-11-01 DIAGNOSIS — M5033 Other cervical disc degeneration, cervicothoracic region: Secondary | ICD-10-CM | POA: Diagnosis not present

## 2020-11-01 DIAGNOSIS — M9901 Segmental and somatic dysfunction of cervical region: Secondary | ICD-10-CM | POA: Diagnosis not present

## 2020-11-03 DIAGNOSIS — M5033 Other cervical disc degeneration, cervicothoracic region: Secondary | ICD-10-CM | POA: Diagnosis not present

## 2020-11-03 DIAGNOSIS — M9901 Segmental and somatic dysfunction of cervical region: Secondary | ICD-10-CM | POA: Diagnosis not present

## 2020-11-04 DIAGNOSIS — M9901 Segmental and somatic dysfunction of cervical region: Secondary | ICD-10-CM | POA: Diagnosis not present

## 2020-11-04 DIAGNOSIS — M5033 Other cervical disc degeneration, cervicothoracic region: Secondary | ICD-10-CM | POA: Diagnosis not present

## 2020-11-08 DIAGNOSIS — M5033 Other cervical disc degeneration, cervicothoracic region: Secondary | ICD-10-CM | POA: Diagnosis not present

## 2020-11-08 DIAGNOSIS — M9901 Segmental and somatic dysfunction of cervical region: Secondary | ICD-10-CM | POA: Diagnosis not present

## 2020-11-10 DIAGNOSIS — M9901 Segmental and somatic dysfunction of cervical region: Secondary | ICD-10-CM | POA: Diagnosis not present

## 2020-11-10 DIAGNOSIS — M5033 Other cervical disc degeneration, cervicothoracic region: Secondary | ICD-10-CM | POA: Diagnosis not present

## 2020-11-11 DIAGNOSIS — M5033 Other cervical disc degeneration, cervicothoracic region: Secondary | ICD-10-CM | POA: Diagnosis not present

## 2020-11-11 DIAGNOSIS — M9901 Segmental and somatic dysfunction of cervical region: Secondary | ICD-10-CM | POA: Diagnosis not present

## 2020-11-12 DIAGNOSIS — I10 Essential (primary) hypertension: Secondary | ICD-10-CM | POA: Diagnosis not present

## 2020-11-15 DIAGNOSIS — M5033 Other cervical disc degeneration, cervicothoracic region: Secondary | ICD-10-CM | POA: Diagnosis not present

## 2020-11-15 DIAGNOSIS — M9901 Segmental and somatic dysfunction of cervical region: Secondary | ICD-10-CM | POA: Diagnosis not present

## 2020-11-16 DIAGNOSIS — Z23 Encounter for immunization: Secondary | ICD-10-CM | POA: Diagnosis not present

## 2020-11-17 DIAGNOSIS — M9901 Segmental and somatic dysfunction of cervical region: Secondary | ICD-10-CM | POA: Diagnosis not present

## 2020-11-17 DIAGNOSIS — M5033 Other cervical disc degeneration, cervicothoracic region: Secondary | ICD-10-CM | POA: Diagnosis not present

## 2020-11-18 DIAGNOSIS — M9901 Segmental and somatic dysfunction of cervical region: Secondary | ICD-10-CM | POA: Diagnosis not present

## 2020-11-18 DIAGNOSIS — M5033 Other cervical disc degeneration, cervicothoracic region: Secondary | ICD-10-CM | POA: Diagnosis not present

## 2020-11-23 DIAGNOSIS — H353131 Nonexudative age-related macular degeneration, bilateral, early dry stage: Secondary | ICD-10-CM | POA: Diagnosis not present

## 2020-12-13 DIAGNOSIS — I10 Essential (primary) hypertension: Secondary | ICD-10-CM | POA: Diagnosis not present

## 2021-01-12 DIAGNOSIS — I10 Essential (primary) hypertension: Secondary | ICD-10-CM | POA: Diagnosis not present

## 2021-01-14 ENCOUNTER — Other Ambulatory Visit: Payer: Self-pay | Admitting: Internal Medicine

## 2021-01-14 DIAGNOSIS — Z139 Encounter for screening, unspecified: Secondary | ICD-10-CM

## 2021-01-17 ENCOUNTER — Ambulatory Visit
Admission: RE | Admit: 2021-01-17 | Discharge: 2021-01-17 | Disposition: A | Discharge status: Home / Self Care | Payer: Medicare Other | Source: Ambulatory Visit | Attending: Internal Medicine | Admitting: Internal Medicine

## 2021-01-17 ENCOUNTER — Other Ambulatory Visit: Payer: Self-pay

## 2021-01-17 DIAGNOSIS — Z139 Encounter for screening, unspecified: Secondary | ICD-10-CM

## 2021-01-17 DIAGNOSIS — Z1231 Encounter for screening mammogram for malignant neoplasm of breast: Secondary | ICD-10-CM | POA: Diagnosis not present

## 2021-01-18 ENCOUNTER — Encounter: Payer: Self-pay | Admitting: Obstetrics & Gynecology

## 2021-01-19 ENCOUNTER — Inpatient Hospital Stay: Payer: Medicare Other | Attending: Obstetrics & Gynecology | Admitting: Obstetrics & Gynecology

## 2021-01-19 ENCOUNTER — Other Ambulatory Visit: Payer: Self-pay

## 2021-01-19 VITALS — BP 174/42 | HR 62 | Temp 97.9°F | Resp 16 | Ht 66.0 in | Wt 175.0 lb

## 2021-01-19 DIAGNOSIS — Z8542 Personal history of malignant neoplasm of other parts of uterus: Secondary | ICD-10-CM | POA: Diagnosis not present

## 2021-01-19 DIAGNOSIS — Z90710 Acquired absence of both cervix and uterus: Secondary | ICD-10-CM | POA: Insufficient documentation

## 2021-01-19 DIAGNOSIS — C541 Malignant neoplasm of endometrium: Secondary | ICD-10-CM

## 2021-01-19 DIAGNOSIS — Z90722 Acquired absence of ovaries, bilateral: Secondary | ICD-10-CM | POA: Diagnosis not present

## 2021-01-19 NOTE — Patient Instructions (Signed)
Return in 1 year ?

## 2021-01-19 NOTE — Progress Notes (Signed)
Follow Up Note: Gyn-Onc  TU SHIMMEL 70 y.o. female  CC: She presents for a f/u visit  HPI:  Oncology History  Endometrial carcinoma ([place])  12/06/2017 Initial Diagnosis   Endometrial carcinoma (HCC) D&C-->grade 1 endometrioid adenocarcinoma   12/27/2017 Definitive Surgery   Robotic-assisted laparoscopic total hysterectomy with bilateral salpingoophorectomy SLN biopsy Pelvic washings Right pelvic lymph sampling  Diagnosis 1. Uterus +/- tubes/ovaries, neoplastic, cervix, bilateral tubes and ovaries UTERUS: - ENDOMETRIOID CARCINOMA, FIGO GRADE I, ARISING IN AN ENDOMETRIAL POLYP WITH EXTENSION INTO THE MYOMETRIUM - LEIOMYOMATA (0.6 CM; LARGEST) - ADENOMYOSIS - SEE ONCOLOGY TABLE AND COMMENT BELOW CERVIX: - UNINVOLVED BY CARCINOMA BILATERAL OVARIES: - UNINVOLVED BY CARCINOMA BILATERAL FALLOPIAN TUBES: - UNINVOLVED BY CARCINOMA 2. Lymph node, sentinel, biopsy, left obturator - NO CARCINOMA IDENTIFIED IN ONE LYMPH NODE (0/1) - SEE COMMENT 3. Lymph node, sentinel, biopsy, left external iliac - NO CARCINOMA IDENTIFIED IN ONE LYMPH NODE (0/1) - SEE COMMENT 4. Lymph node, sentinel, biopsy, right obturator - NO LYMPHOID TISSUE IDENTIFIED 5. Lymph node, sentinel, biopsy, right external iliac - NO CARCINOMA IDENTIFIED IN ONE LYMPH NODE (0/1)   Endometrial cancer ([place])  12/27/2017 Initial Diagnosis   Endometrial cancer (HCC)     Interval History: She denies any vaginal bleeding, abdominal/pelvic pain, cough, lethargy or abdominal distention.    Review of Systems Review of Systems  Constitutional:  Negative for malaise/fatigue and weight loss.  Respiratory:  Negative for cough.   Gastrointestinal:  Negative for abdominal pain, nausea and vomiting.  Genitourinary:        No vaginal bleeding   Current Meds:  Outpatient Encounter Medications as of 01/19/2021  Medication Sig   acetaminophen  (TYLENOL) 650 MG CR tablet Take 650 mg by mouth at bedtime.   aspirin EC 81 MG tablet Take 1 tablet (81 mg total) by mouth every evening. Begin taking 3 days after surgery   Calcium Carb-Cholecalciferol (CALTRATE 600+D3 PO) Take 1 tablet by mouth daily.   FLOWFLEX COVID-19 AG HOME TEST KIT Test AS DIRECTED FOR Covid-19   lisinopril (PRINIVIL,ZESTRIL) 20 MG tablet Take 20 mg by mouth daily.   Multiple Vitamins-Minerals (PRESERVISION AREDS 2 PO) Take 1 tablet by mouth 2 (two) times daily.   pravastatin (PRAVACHOL) 10 MG tablet Take 10 mg by mouth daily after supper.    senna-docusate (SENOKOT-S) 8.6-50 MG tablet Take 1 tablet by mouth at bedtime. Stop if having loose stools   sertraline (ZOLOFT) 25 MG tablet Take 1 tablet (25 mg total) by mouth daily. (Patient not taking: Reported on 01/18/2021)   No facility-administered encounter medications on file as of 01/19/2021.    Allergy: No Known Allergies  Social Hx:   Social History   Socioeconomic History   Marital status: Single    Spouse name: Not on file   Number of children: Not on file   Years of education: Not on file   Highest education level: Not on file  Occupational History   Not on file  Tobacco Use   Smoking status: Never   Smokeless tobacco: Never  Vaping Use   Vaping Use: Never used  Substance and Sexual Activity   Alcohol use: No   Drug use: No   Sexual activity: Not on file  Other Topics Concern   Not on file  Social History Narrative   Not on file   Social Determinants of Health   Financial Resource Strain: Not on file  Food Insecurity: Not on file  Transportation Needs: Not on file  Physical Activity: Not on file  Stress: Not on file  Social Connections: Not on file  Intimate Partner Violence: Not on file    Past Surgical Hx:  Past Surgical History:  Procedure Laterality Date   CATARACT EXTRACTION Left 09/2019   CATARACT EXTRACTION Right 10/2019   COLONOSCOPY  2005   ENDOMETRIAL BIOPSY  11/07/2017    MOUTH SURGERY     [facility] Teeth, about 32yr ago   ROBOTIC ASSISTED TOTAL HYSTERECTOMY WITH BILATERAL SALPINGO OOPHERECTOMY Bilateral 12/27/2017   Procedure: XI ROBOTIC ASSISTED TOTAL HYSTERECTOMY WITH BILATERAL SALPINGO OOPHORECTOMY,  STAGING;  Surgeon: PIsabel Caprice MD;  Location: [facility];  Service: Gynecology;  Laterality: Bilateral;   SENTINEL NODE BIOPSY N/A 12/27/2017   Procedure: SENTINEL NODE BIOPSY;  Surgeon: PIsabel Caprice MD;  Location: [facility];  Service: Gynecology;  Laterality: N/A;    Past Medical Hx:  Past Medical History:  Diagnosis Date   Anesthesia    Difficulty waking up after surgery   Anxiety    Arthritis    Complication of anesthesia    slow to wake up with biopsy on 12/06/2017    Depression    Family history of adverse reaction to anesthesia    mother- slow to wake up    GERD (gastroesophageal reflux disease)    Hemorrhoids    Hyperlipidemia    Hypertension    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 Diagosed by a urologist "starts with a c" and a type that women usually get   Breast cancer Neg Hx     Vitals:  BP (!) 174/42 (BP Location: Left Arm, Patient Position: Sitting)   Pulse 62   Temp 97.9 F (36.6 C) (Tympanic)   Resp 16   Ht 5' 6"  (1.676 m)   Wt 175 lb (79.4 kg)   SpO2 100%   BMI 28.25 kg/m    Physical Exam Constitutional:       General: She is not in acute distress.    Appearance: Normal appearance.  Abdominal:     General: There is no distension.     Palpations: Abdomen is soft. There is no mass.     Tenderness: There is no abdominal tenderness.  Genitourinary:    General: Normal vulva.     Vagina: Normal.     Adnexa: Right adnexa normal and left adnexa normal.  Lymphadenopathy:     Upper Body:     Right upper body: No supraclavicular adenopathy.     Left upper body: No supraclavicular adenopathy.     Lower Body: No right inguinal adenopathy. No left inguinal adenopathy.  Neurological:  Mental Status: She is alert.    Assessment/Plan: Endometrial carcinoma (HCC) Stage IA Grade 1 EC Negative symptom review, normal exam.  NED  >return in 1 yr; f/u w/a generalist gynecologist is appropriate   I personally spent 25 minutes face-to-face and non-face-to-face in the care of this patient, which includes all pre, intra, and post visit time on the date of service.   Lahoma Crocker, MD 01/19/2021, 11:09 AM

## 2021-01-19 NOTE — Assessment & Plan Note (Signed)
Stage IA Grade 1 EC Negative symptom review, normal exam.  NED  >return in 1 yr; f/u w/a generalist gynecologist is appropriate

## 2021-01-20 ENCOUNTER — Encounter: Payer: Self-pay | Admitting: Obstetrics & Gynecology

## 2021-02-11 DIAGNOSIS — I10 Essential (primary) hypertension: Secondary | ICD-10-CM | POA: Diagnosis not present

## 2021-03-13 DIAGNOSIS — I10 Essential (primary) hypertension: Secondary | ICD-10-CM | POA: Diagnosis not present

## 2021-03-24 DIAGNOSIS — Z713 Dietary counseling and surveillance: Secondary | ICD-10-CM | POA: Diagnosis not present

## 2021-03-24 DIAGNOSIS — Z789 Other specified health status: Secondary | ICD-10-CM | POA: Diagnosis not present

## 2021-03-24 DIAGNOSIS — Z299 Encounter for prophylactic measures, unspecified: Secondary | ICD-10-CM | POA: Diagnosis not present

## 2021-03-24 DIAGNOSIS — I10 Essential (primary) hypertension: Secondary | ICD-10-CM | POA: Diagnosis not present

## 2021-03-24 DIAGNOSIS — Z6832 Body mass index (BMI) 32.0-32.9, adult: Secondary | ICD-10-CM | POA: Diagnosis not present

## 2021-04-12 DIAGNOSIS — I10 Essential (primary) hypertension: Secondary | ICD-10-CM | POA: Diagnosis not present

## 2021-05-12 DIAGNOSIS — I10 Essential (primary) hypertension: Secondary | ICD-10-CM | POA: Diagnosis not present

## 2021-05-31 DIAGNOSIS — Z23 Encounter for immunization: Secondary | ICD-10-CM | POA: Diagnosis not present

## 2021-06-12 DIAGNOSIS — I10 Essential (primary) hypertension: Secondary | ICD-10-CM | POA: Diagnosis not present

## 2021-07-12 DIAGNOSIS — I10 Essential (primary) hypertension: Secondary | ICD-10-CM | POA: Diagnosis not present

## 2021-08-11 DIAGNOSIS — I10 Essential (primary) hypertension: Secondary | ICD-10-CM | POA: Diagnosis not present

## 2021-09-12 DIAGNOSIS — I10 Essential (primary) hypertension: Secondary | ICD-10-CM | POA: Diagnosis not present

## 2021-09-22 DIAGNOSIS — Z7189 Other specified counseling: Secondary | ICD-10-CM | POA: Diagnosis not present

## 2021-09-22 DIAGNOSIS — Z6831 Body mass index (BMI) 31.0-31.9, adult: Secondary | ICD-10-CM | POA: Diagnosis not present

## 2021-09-22 DIAGNOSIS — Z0000 Encounter for general adult medical examination without abnormal findings: Secondary | ICD-10-CM | POA: Diagnosis not present

## 2021-09-22 DIAGNOSIS — Z1331 Encounter for screening for depression: Secondary | ICD-10-CM | POA: Diagnosis not present

## 2021-09-22 DIAGNOSIS — Z1211 Encounter for screening for malignant neoplasm of colon: Secondary | ICD-10-CM | POA: Diagnosis not present

## 2021-09-22 DIAGNOSIS — I10 Essential (primary) hypertension: Secondary | ICD-10-CM | POA: Diagnosis not present

## 2021-09-22 DIAGNOSIS — R5383 Other fatigue: Secondary | ICD-10-CM | POA: Diagnosis not present

## 2021-09-22 DIAGNOSIS — Z79899 Other long term (current) drug therapy: Secondary | ICD-10-CM | POA: Diagnosis not present

## 2021-09-22 DIAGNOSIS — Z1339 Encounter for screening examination for other mental health and behavioral disorders: Secondary | ICD-10-CM | POA: Diagnosis not present

## 2021-09-22 DIAGNOSIS — E559 Vitamin D deficiency, unspecified: Secondary | ICD-10-CM | POA: Diagnosis not present

## 2021-09-22 DIAGNOSIS — Z299 Encounter for prophylactic measures, unspecified: Secondary | ICD-10-CM | POA: Diagnosis not present

## 2021-09-22 DIAGNOSIS — E7800 Pure hypercholesterolemia, unspecified: Secondary | ICD-10-CM | POA: Diagnosis not present

## 2021-10-12 DIAGNOSIS — I10 Essential (primary) hypertension: Secondary | ICD-10-CM | POA: Diagnosis not present

## 2021-10-19 ENCOUNTER — Telehealth: Payer: Self-pay | Admitting: *Deleted

## 2021-10-19 NOTE — Telephone Encounter (Signed)
Patient called and scheduled a follow up appt with Dr Delsa Sale for 12/6

## 2021-11-02 DIAGNOSIS — Z23 Encounter for immunization: Secondary | ICD-10-CM | POA: Diagnosis not present

## 2021-11-11 DIAGNOSIS — I10 Essential (primary) hypertension: Secondary | ICD-10-CM | POA: Diagnosis not present

## 2021-11-22 DIAGNOSIS — H524 Presbyopia: Secondary | ICD-10-CM | POA: Diagnosis not present

## 2021-11-22 DIAGNOSIS — H353132 Nonexudative age-related macular degeneration, bilateral, intermediate dry stage: Secondary | ICD-10-CM | POA: Diagnosis not present

## 2021-12-12 DIAGNOSIS — I10 Essential (primary) hypertension: Secondary | ICD-10-CM | POA: Diagnosis not present

## 2021-12-22 ENCOUNTER — Other Ambulatory Visit: Payer: Self-pay | Admitting: Internal Medicine

## 2021-12-22 DIAGNOSIS — Z1231 Encounter for screening mammogram for malignant neoplasm of breast: Secondary | ICD-10-CM

## 2022-01-11 DIAGNOSIS — I10 Essential (primary) hypertension: Secondary | ICD-10-CM | POA: Diagnosis not present

## 2022-01-18 ENCOUNTER — Inpatient Hospital Stay: Payer: Medicare Other | Attending: Obstetrics & Gynecology | Admitting: Obstetrics & Gynecology

## 2022-01-18 ENCOUNTER — Encounter: Payer: Self-pay | Admitting: Obstetrics & Gynecology

## 2022-01-18 ENCOUNTER — Other Ambulatory Visit: Payer: Self-pay

## 2022-01-18 VITALS — BP 199/58 | HR 78 | Temp 98.0°F | Resp 16 | Ht 65.35 in | Wt 171.0 lb

## 2022-01-18 DIAGNOSIS — Z90710 Acquired absence of both cervix and uterus: Secondary | ICD-10-CM | POA: Diagnosis not present

## 2022-01-18 DIAGNOSIS — Z90722 Acquired absence of ovaries, bilateral: Secondary | ICD-10-CM | POA: Insufficient documentation

## 2022-01-18 DIAGNOSIS — C541 Malignant neoplasm of endometrium: Secondary | ICD-10-CM

## 2022-01-18 DIAGNOSIS — Z8542 Personal history of malignant neoplasm of other parts of uterus: Secondary | ICD-10-CM | POA: Diagnosis not present

## 2022-01-18 NOTE — Assessment & Plan Note (Addendum)
71 yo w/Stage IA Grade 1 EC Negative symptom review, normal exam.  NED Elevated B/P   >encouraged f/u w/PCP >return in 1 yr; f/u w/a generalist gynecologist is appropriate

## 2022-01-18 NOTE — Patient Instructions (Signed)
Return in 1 year ?

## 2022-01-18 NOTE — Progress Notes (Signed)
Follow Up Note: Gyn-Onc  Whitney Vaughan 71 y.o. female  CC: She presents for a f/u visit  HPI:  Oncology History  Endometrial carcinoma ([place])  12/06/2017 Initial Diagnosis   Endometrial carcinoma (HCC) D&C-->grade 1 endometrioid adenocarcinoma   12/27/2017 Definitive Surgery   Robotic-assisted laparoscopic total hysterectomy with bilateral salpingoophorectomy SLN biopsy Pelvic washings Right pelvic lymph sampling  Diagnosis 1. Uterus +/- tubes/ovaries, neoplastic, cervix, bilateral tubes and ovaries UTERUS: - ENDOMETRIOID CARCINOMA, FIGO GRADE I, ARISING IN AN ENDOMETRIAL POLYP WITH EXTENSION INTO THE MYOMETRIUM - LEIOMYOMATA (0.6 CM; LARGEST) - ADENOMYOSIS - SEE ONCOLOGY TABLE AND COMMENT BELOW CERVIX: - UNINVOLVED BY CARCINOMA BILATERAL OVARIES: - UNINVOLVED BY CARCINOMA BILATERAL FALLOPIAN TUBES: - UNINVOLVED BY CARCINOMA 2. Lymph node, sentinel, biopsy, left obturator - NO CARCINOMA IDENTIFIED IN ONE LYMPH NODE (0/1) - SEE COMMENT 3. Lymph node, sentinel, biopsy, left external iliac - NO CARCINOMA IDENTIFIED IN ONE LYMPH NODE (0/1) - SEE COMMENT 4. Lymph node, sentinel, biopsy, right obturator - NO LYMPHOID TISSUE IDENTIFIED 5. Lymph node, sentinel, biopsy, right external iliac - NO CARCINOMA IDENTIFIED IN ONE LYMPH NODE (0/1)   Endometrial cancer ([place])  12/27/2017 Initial Diagnosis   Endometrial cancer (HCC)     Interval History: She denies any vaginal bleeding, abdominal/pelvic pain, cough, lethargy or abdominal distention.    Review of Systems Review of Systems  Constitutional:  Negative for malaise/fatigue and weight loss.  Respiratory:  Negative for cough.   Gastrointestinal:  Negative for abdominal pain, nausea and vomiting.  Genitourinary:        No vaginal bleeding    Current Meds:  Outpatient Encounter Medications as of 01/18/2022  Medication Sig   acetaminophen  (TYLENOL) 650 MG CR tablet Take 650 mg by mouth at bedtime.   aspirin EC 81 MG tablet Take 1 tablet (81 mg total) by mouth every evening. Begin taking 3 days after surgery   Calcium Carb-Cholecalciferol (CALTRATE 600+D3 PO) Take 1 tablet by mouth daily.   FLOWFLEX COVID-19 AG HOME TEST KIT Test AS DIRECTED FOR Covid-19   lisinopril (PRINIVIL,ZESTRIL) 20 MG tablet Take 20 mg by mouth daily.   Multiple Vitamins-Minerals (PRESERVISION AREDS 2 PO) Take 1 tablet by mouth 2 (two) times daily.   pravastatin (PRAVACHOL) 10 MG tablet Take 10 mg by mouth daily after supper.    senna-docusate (SENOKOT-S) 8.6-50 MG tablet Take 1 tablet by mouth at bedtime. Stop if having loose stools   sertraline (ZOLOFT) 25 MG tablet Take 1 tablet (25 mg total) by mouth daily. (Patient not taking: Reported on 01/18/2021)   No facility-administered encounter medications on file as of 01/18/2022.    Allergy: No Known Allergies  Social Hx:   Social History   Socioeconomic History   Marital status: Single    Spouse name: Not on file   Number of children: Not on file   Years of education: Not on file   Highest education level: Not on file  Occupational History   Not on file  Tobacco Use   Smoking status: Never   Smokeless tobacco: Never  Vaping Use   Vaping Use: Never used  Substance and Sexual Activity   Alcohol use: No   Drug use: No   Sexual activity: Not on file  Other Topics Concern   Not on file  Social History Narrative   Not on file   Social Determinants of Health   Financial Resource Strain: Not on file  Food Insecurity: Not on file  Transportation Needs: Not on file  Physical Activity: Not on file  Stress: Not on file  Social Connections: Not on file  Intimate Partner Violence: Not on file    Past Surgical Hx:  Past Surgical History:  Procedure Laterality Date   CATARACT EXTRACTION Left 09/2019   CATARACT EXTRACTION Right 10/2019   COLONOSCOPY  2005   ENDOMETRIAL BIOPSY  11/07/2017    MOUTH SURGERY     [facility] Teeth, about 26yr ago   ROBOTIC ASSISTED TOTAL HYSTERECTOMY WITH BILATERAL SALPINGO OOPHERECTOMY Bilateral 12/27/2017   Procedure: XI ROBOTIC ASSISTED TOTAL HYSTERECTOMY WITH BILATERAL SALPINGO OOPHORECTOMY,  STAGING;  Surgeon: PIsabel Caprice MD;  Location: [facility];  Service: Gynecology;  Laterality: Bilateral;   SENTINEL NODE BIOPSY N/A 12/27/2017   Procedure: SENTINEL NODE BIOPSY;  Surgeon: PIsabel Caprice MD;  Location: [facility];  Service: Gynecology;  Laterality: N/A;    Past Medical Hx:  Past Medical History:  Diagnosis Date   Anesthesia    Difficulty waking up after surgery   Anxiety    Arthritis    Complication of anesthesia    slow to wake up with biopsy on 12/06/2017    Depression    Family history of adverse reaction to anesthesia    mother- slow to wake up    GERD (gastroesophageal reflux disease)    Hemorrhoids    Hyperlipidemia    Hypertension    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 Diagosed by a urologist "starts with a c" and a type that women usually get   Breast cancer Neg Hx     Vitals:  BP (!) 199/58 (BP Location: Right Arm, Patient Position: Sitting) Comment: takes BP meds; informed DR JDelsa Sale  Pulse 78   Temp 98 F (36.7 C) (Oral)   Resp 16   Ht 5' 5.35" (1.66 m)   Wt 171 lb (77.6 kg)   SpO2 96%   BMI 28.15  kg/m    Physical Exam Constitutional:      General: She is not in acute distress.    Appearance: Normal appearance.  Abdominal:     General: There is no distension.     Palpations: Abdomen is soft. There is no mass.     Tenderness: There is no abdominal tenderness.  Genitourinary:    General: Normal vulva.     Vagina: Normal.     Adnexa: Right adnexa normal and left adnexa normal.  Lymphadenopathy:     Upper Body:     Right upper body: No supraclavicular adenopathy.     Left upper body: No supraclavicular adenopathy.     Lower Body: No right inguinal adenopathy. No  left inguinal adenopathy.  Neurological:     Mental Status: She is alert.     Assessment/Plan:Endometrial cancer ([place]) 71 yo w/Stage IA Grade 1 EC Negative symptom review, normal exam.  NED Elevated B/P   >encouraged f/u w/PCP >return in 1 yr; f/u w/a generalist gynecologist is appropriate      I personally spent 25 minutes face-to-face and non-face-to-face in the care of this patient, which includes all pre, intra, and post visit time on the date of service.   Lahoma Crocker, MD 01/18/2022, 8:30 AM

## 2022-01-23 ENCOUNTER — Ambulatory Visit
Admission: RE | Admit: 2022-01-23 | Discharge: 2022-01-23 | Disposition: A | Discharge status: Home / Self Care | Payer: Medicare Other | Source: Ambulatory Visit | Attending: Internal Medicine | Admitting: Internal Medicine

## 2022-01-23 DIAGNOSIS — Z1231 Encounter for screening mammogram for malignant neoplasm of breast: Secondary | ICD-10-CM | POA: Diagnosis not present

## 2022-02-10 DIAGNOSIS — I10 Essential (primary) hypertension: Secondary | ICD-10-CM | POA: Diagnosis not present

## 2022-03-13 DIAGNOSIS — I10 Essential (primary) hypertension: Secondary | ICD-10-CM | POA: Diagnosis not present

## 2022-03-27 DIAGNOSIS — Z6832 Body mass index (BMI) 32.0-32.9, adult: Secondary | ICD-10-CM | POA: Diagnosis not present

## 2022-03-27 DIAGNOSIS — F329 Major depressive disorder, single episode, unspecified: Secondary | ICD-10-CM | POA: Diagnosis not present

## 2022-03-27 DIAGNOSIS — I10 Essential (primary) hypertension: Secondary | ICD-10-CM | POA: Diagnosis not present

## 2022-03-27 DIAGNOSIS — Z299 Encounter for prophylactic measures, unspecified: Secondary | ICD-10-CM | POA: Diagnosis not present

## 2022-04-10 DIAGNOSIS — Z299 Encounter for prophylactic measures, unspecified: Secondary | ICD-10-CM | POA: Diagnosis not present

## 2022-04-10 DIAGNOSIS — I10 Essential (primary) hypertension: Secondary | ICD-10-CM | POA: Diagnosis not present

## 2022-04-10 DIAGNOSIS — R5383 Other fatigue: Secondary | ICD-10-CM | POA: Diagnosis not present

## 2022-04-12 DIAGNOSIS — I10 Essential (primary) hypertension: Secondary | ICD-10-CM | POA: Diagnosis not present

## 2022-05-08 DIAGNOSIS — I10 Essential (primary) hypertension: Secondary | ICD-10-CM | POA: Diagnosis not present

## 2022-05-08 DIAGNOSIS — H6690 Otitis media, unspecified, unspecified ear: Secondary | ICD-10-CM | POA: Diagnosis not present

## 2022-05-08 DIAGNOSIS — Z299 Encounter for prophylactic measures, unspecified: Secondary | ICD-10-CM | POA: Diagnosis not present

## 2022-05-08 DIAGNOSIS — H6123 Impacted cerumen, bilateral: Secondary | ICD-10-CM | POA: Diagnosis not present

## 2022-05-13 DIAGNOSIS — I10 Essential (primary) hypertension: Secondary | ICD-10-CM | POA: Diagnosis not present

## 2022-05-15 DIAGNOSIS — E7800 Pure hypercholesterolemia, unspecified: Secondary | ICD-10-CM | POA: Diagnosis not present

## 2022-05-15 DIAGNOSIS — I10 Essential (primary) hypertension: Secondary | ICD-10-CM | POA: Diagnosis not present

## 2022-05-15 DIAGNOSIS — Z299 Encounter for prophylactic measures, unspecified: Secondary | ICD-10-CM | POA: Diagnosis not present

## 2022-05-15 DIAGNOSIS — H6123 Impacted cerumen, bilateral: Secondary | ICD-10-CM | POA: Diagnosis not present

## 2022-06-13 DIAGNOSIS — I10 Essential (primary) hypertension: Secondary | ICD-10-CM | POA: Diagnosis not present

## 2022-07-03 DIAGNOSIS — H1045 Other chronic allergic conjunctivitis: Secondary | ICD-10-CM | POA: Diagnosis not present

## 2022-07-14 DIAGNOSIS — I10 Essential (primary) hypertension: Secondary | ICD-10-CM | POA: Diagnosis not present

## 2022-07-16 IMAGING — MG MM DIGITAL SCREENING BILAT W/ TOMO AND CAD
8 series · 8 of 24 positions shown · non-contrast
Comparison: Previous exam(s).

ACR Breast Density Category a: The breast tissue is almost entirely
fatty.

CLINICAL DATA: Screening.

EXAM:
DIGITAL SCREENING BILATERAL MAMMOGRAM WITH TOMOSYNTHESIS AND CAD
TECHNIQUE: Bilateral screening digital craniocaudal and mediolateral oblique
mammograms were obtained. Bilateral screening digital breast
tomosynthesis was performed. The images were evaluated with
computer-aided detection.

[R MLO synth-2D]
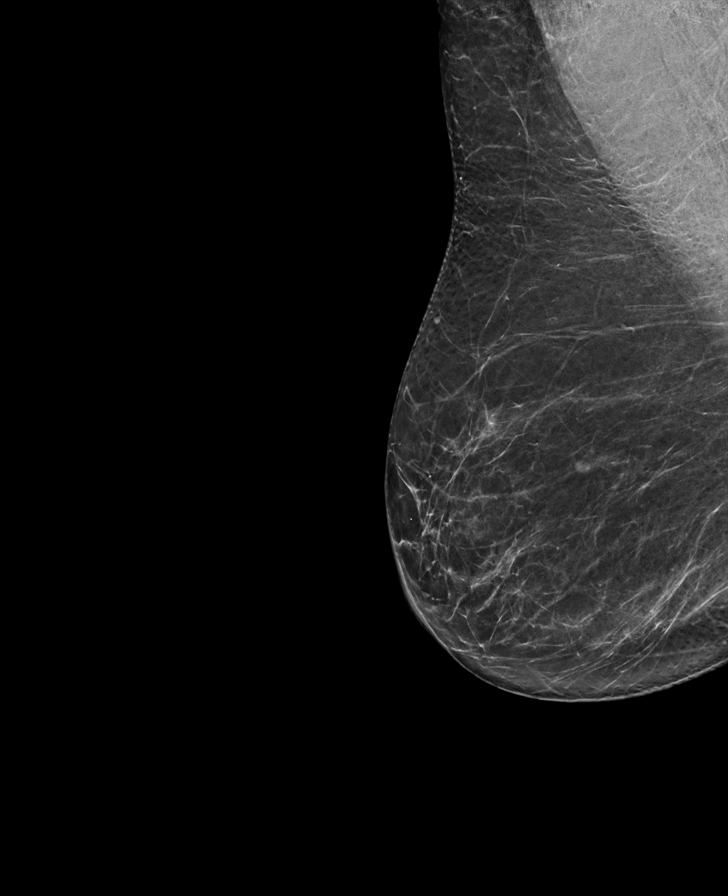

[L MLO synth-2D]
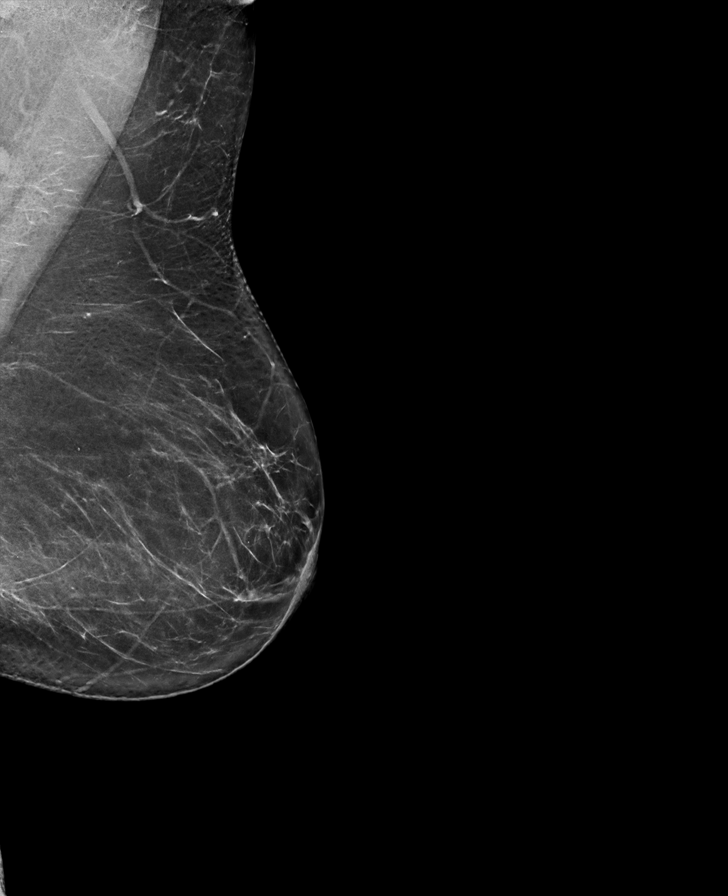

[L CC synth-2D]
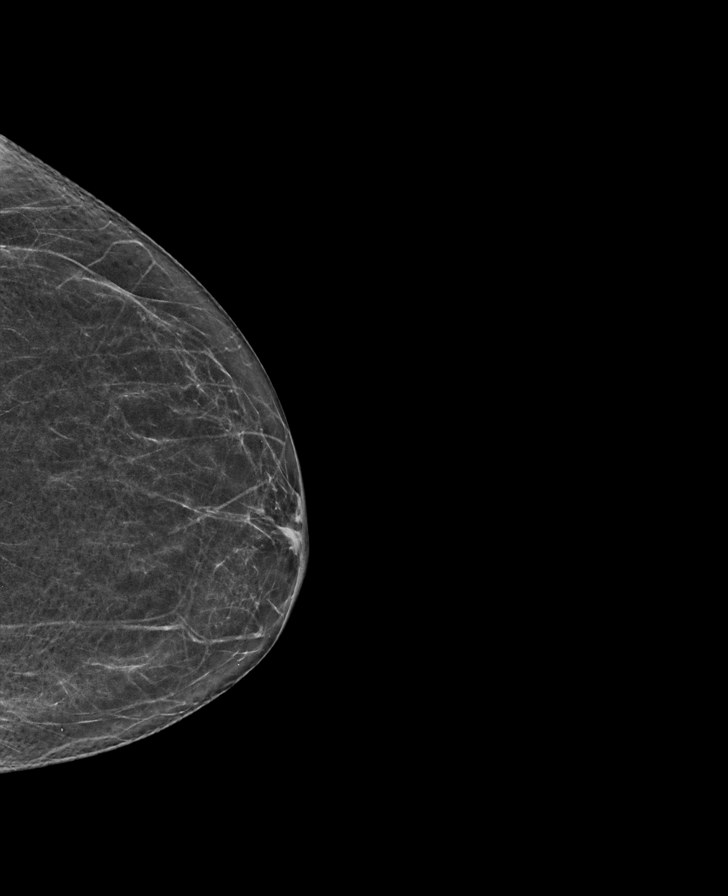

[R CC synth-2D]
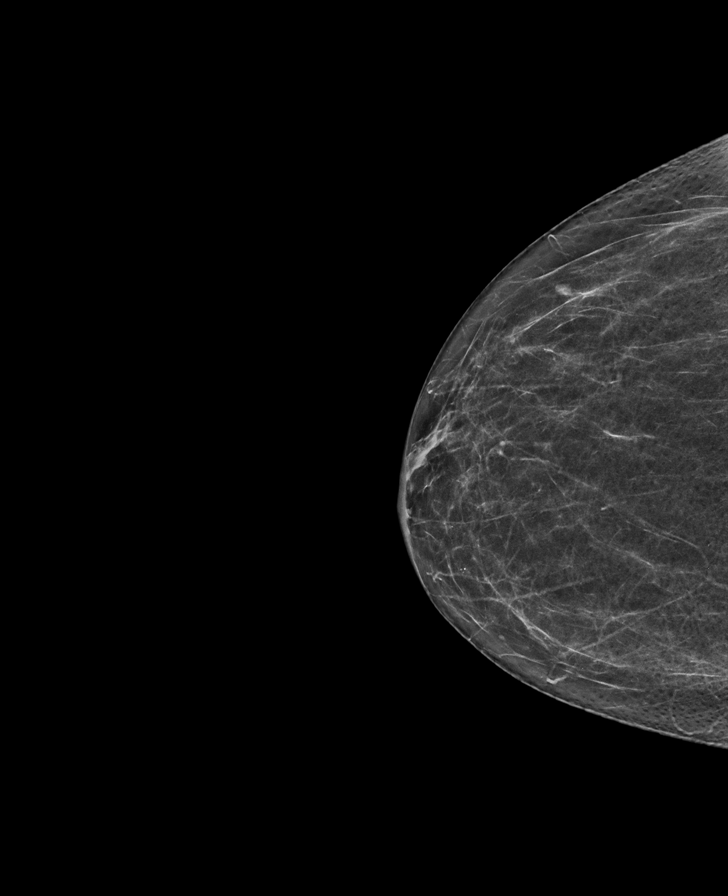

[R CC tomo · tomo slice 31/61.0]
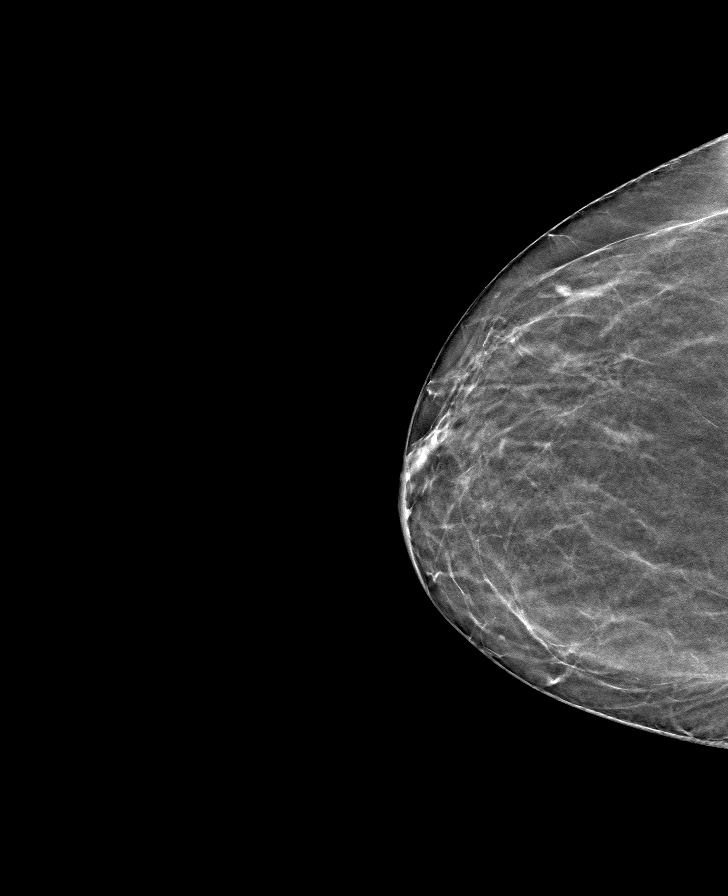

[L MLO tomo · tomo slice 40/79.0]
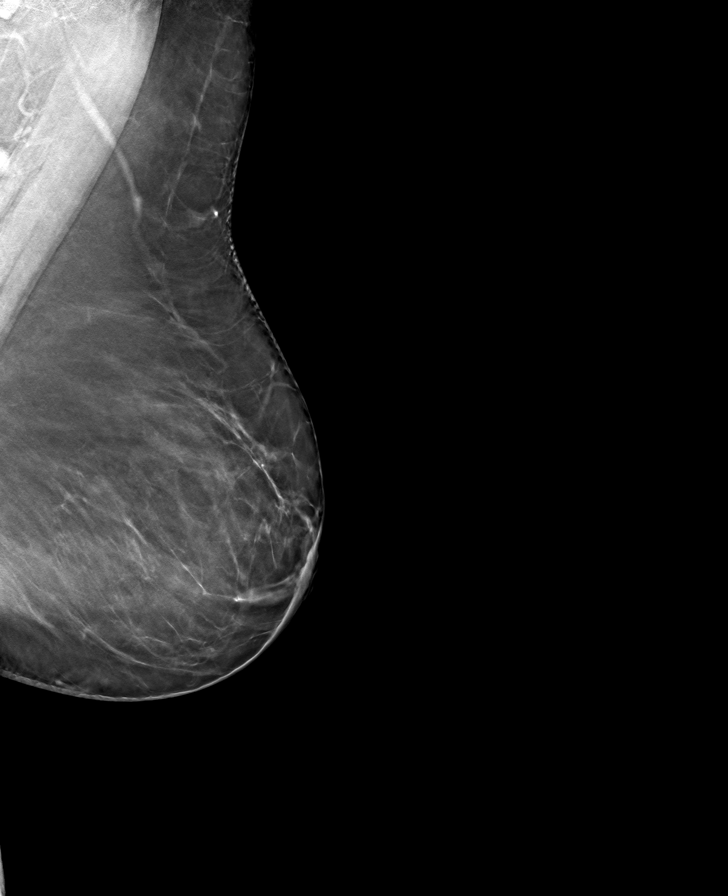

[L CC tomo · tomo slice 31/60.0]
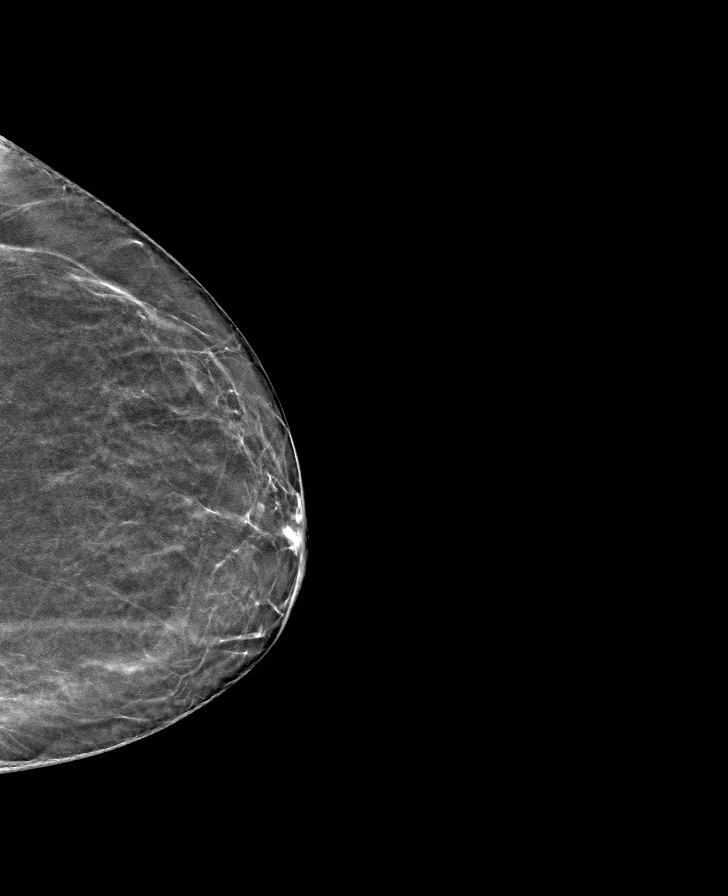

[R MLO tomo · tomo slice 35/70.0]
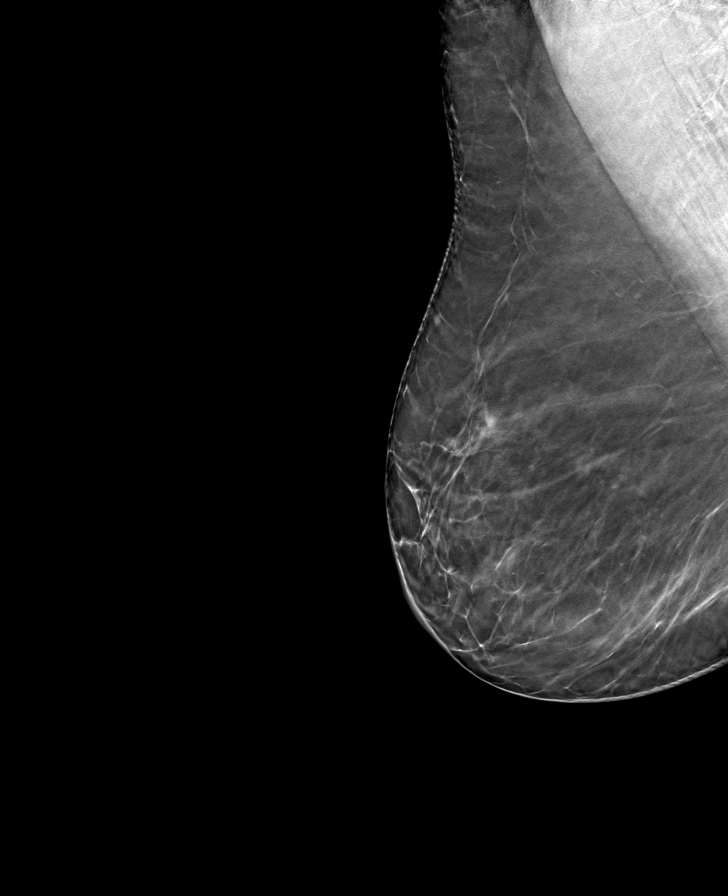

[8 of 24 positions shown; findings below may reference images not displayed]

FINDINGS: There are no findings suspicious for malignancy.
IMPRESSION: No mammographic evidence of malignancy. A result letter of this
screening mammogram will be mailed directly to the patient.

RECOMMENDATION:
Screening mammogram in one year. (Code:0E-3-N98)

BI-RADS CATEGORY  1: Negative.

## 2022-08-13 DIAGNOSIS — I10 Essential (primary) hypertension: Secondary | ICD-10-CM | POA: Diagnosis not present

## 2022-08-23 ENCOUNTER — Telehealth: Payer: Self-pay | Admitting: *Deleted

## 2022-08-23 NOTE — Telephone Encounter (Signed)
Spoke with [name] who called the office to schedule a 6 month follow up with Dr. Tamela Oddi. Pt was given an appointment for Wednesday, 01/24/23 at 0930 with arrival time of 0915 for check in. Pt agreed to date and time.

## 2022-09-13 DIAGNOSIS — I10 Essential (primary) hypertension: Secondary | ICD-10-CM | POA: Diagnosis not present

## 2022-09-26 DIAGNOSIS — Z1331 Encounter for screening for depression: Secondary | ICD-10-CM | POA: Diagnosis not present

## 2022-09-26 DIAGNOSIS — Z299 Encounter for prophylactic measures, unspecified: Secondary | ICD-10-CM | POA: Diagnosis not present

## 2022-09-26 DIAGNOSIS — Z7189 Other specified counseling: Secondary | ICD-10-CM | POA: Diagnosis not present

## 2022-09-26 DIAGNOSIS — I10 Essential (primary) hypertension: Secondary | ICD-10-CM | POA: Diagnosis not present

## 2022-09-26 DIAGNOSIS — E559 Vitamin D deficiency, unspecified: Secondary | ICD-10-CM | POA: Diagnosis not present

## 2022-09-26 DIAGNOSIS — Z79899 Other long term (current) drug therapy: Secondary | ICD-10-CM | POA: Diagnosis not present

## 2022-09-26 DIAGNOSIS — Z1211 Encounter for screening for malignant neoplasm of colon: Secondary | ICD-10-CM | POA: Diagnosis not present

## 2022-09-26 DIAGNOSIS — Z0000 Encounter for general adult medical examination without abnormal findings: Secondary | ICD-10-CM | POA: Diagnosis not present

## 2022-09-26 DIAGNOSIS — E7800 Pure hypercholesterolemia, unspecified: Secondary | ICD-10-CM | POA: Diagnosis not present

## 2022-09-26 DIAGNOSIS — R5383 Other fatigue: Secondary | ICD-10-CM | POA: Diagnosis not present

## 2022-09-26 DIAGNOSIS — Z1339 Encounter for screening examination for other mental health and behavioral disorders: Secondary | ICD-10-CM | POA: Diagnosis not present

## 2022-10-06 DIAGNOSIS — E2839 Other primary ovarian failure: Secondary | ICD-10-CM | POA: Diagnosis not present

## 2022-10-14 DIAGNOSIS — I10 Essential (primary) hypertension: Secondary | ICD-10-CM | POA: Diagnosis not present

## 2022-10-30 DIAGNOSIS — Z23 Encounter for immunization: Secondary | ICD-10-CM | POA: Diagnosis not present

## 2022-11-13 DIAGNOSIS — I10 Essential (primary) hypertension: Secondary | ICD-10-CM | POA: Diagnosis not present

## 2022-11-27 DIAGNOSIS — H35373 Puckering of macula, bilateral: Secondary | ICD-10-CM | POA: Diagnosis not present

## 2022-12-13 DIAGNOSIS — I10 Essential (primary) hypertension: Secondary | ICD-10-CM | POA: Diagnosis not present

## 2023-01-12 DIAGNOSIS — I10 Essential (primary) hypertension: Secondary | ICD-10-CM | POA: Diagnosis not present

## 2023-01-17 ENCOUNTER — Other Ambulatory Visit: Payer: Self-pay | Admitting: Internal Medicine

## 2023-01-17 ENCOUNTER — Encounter: Payer: Self-pay | Admitting: Obstetrics & Gynecology

## 2023-01-17 DIAGNOSIS — Z1231 Encounter for screening mammogram for malignant neoplasm of breast: Secondary | ICD-10-CM

## 2023-01-23 ENCOUNTER — Telehealth: Payer: Self-pay

## 2023-01-23 NOTE — Telephone Encounter (Signed)
Whitney Vaughan called stating she has an appointment with Dr.Jackson-Moore tomorrow. She needs to reschedule d/t the potential heavy rain and not sure if her ride will be able to drive in it.   Pt aware once Dr.Jackson-Moores schedule opens up for January our office will call her to schedule. Pt voiced an understanding.

## 2023-01-24 ENCOUNTER — Inpatient Hospital Stay: Payer: Medicare Other | Admitting: Obstetrics & Gynecology

## 2023-01-24 DIAGNOSIS — C541 Malignant neoplasm of endometrium: Secondary | ICD-10-CM

## 2023-01-29 ENCOUNTER — Ambulatory Visit
Admission: RE | Admit: 2023-01-29 | Discharge: 2023-01-29 | Disposition: A | Discharge status: Home / Self Care | Payer: Medicare Other | Source: Ambulatory Visit | Attending: Internal Medicine | Admitting: Internal Medicine

## 2023-01-29 DIAGNOSIS — Z1231 Encounter for screening mammogram for malignant neoplasm of breast: Secondary | ICD-10-CM | POA: Diagnosis not present

## 2023-02-12 DIAGNOSIS — I10 Essential (primary) hypertension: Secondary | ICD-10-CM | POA: Diagnosis not present

## 2023-02-13 ENCOUNTER — Telehealth: Payer: Self-pay

## 2023-02-13 NOTE — Telephone Encounter (Signed)
LVM for patient to call office to get scheduled with DR.Jackson-Moore on January 8th

## 2023-02-15 NOTE — Telephone Encounter (Signed)
 Attempted to reach out to patient regarding scheduling a follow up appointment with Dr.Jackson-Moore

## 2023-02-16 ENCOUNTER — Encounter: Payer: Self-pay | Admitting: Obstetrics & Gynecology

## 2023-02-16 NOTE — Telephone Encounter (Signed)
 Pt is scheduled for a follow up appointment with Dr.Jackson-Moore on Wednesday 02/21/23

## 2023-02-21 ENCOUNTER — Inpatient Hospital Stay: Payer: Medicare Other | Attending: Obstetrics & Gynecology | Admitting: Obstetrics & Gynecology

## 2023-02-21 ENCOUNTER — Encounter: Payer: Self-pay | Admitting: Obstetrics & Gynecology

## 2023-02-21 VITALS — BP 144/60 | HR 74 | Temp 98.4°F | Resp 17 | Wt 183.8 lb

## 2023-02-21 DIAGNOSIS — Z8542 Personal history of malignant neoplasm of other parts of uterus: Secondary | ICD-10-CM | POA: Diagnosis not present

## 2023-02-21 DIAGNOSIS — Z90722 Acquired absence of ovaries, bilateral: Secondary | ICD-10-CM | POA: Diagnosis not present

## 2023-02-21 DIAGNOSIS — Z9079 Acquired absence of other genital organ(s): Secondary | ICD-10-CM | POA: Diagnosis not present

## 2023-02-21 DIAGNOSIS — Z90710 Acquired absence of both cervix and uterus: Secondary | ICD-10-CM | POA: Insufficient documentation

## 2023-02-21 DIAGNOSIS — C541 Malignant neoplasm of endometrium: Secondary | ICD-10-CM

## 2023-02-21 NOTE — Patient Instructions (Signed)
 Return prn

## 2023-02-21 NOTE — Progress Notes (Addendum)
 Follow Up Note: Gyn-Onc  Whitney Vaughan 73 y.o. 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 Uterus +/- tubes/ovaries, neoplastic, cervix, bilateral tubes and ovaries UTERUS: - ENDOMETRIOID CARCINOMA, FIGO GRADE I, ARISING IN AN ENDOMETRIAL POLYP WITH EXTENSION INTO THE MYOMETRIUM - LEIOMYOMATA (0.6 CM; LARGEST) - ADENOMYOSIS - SEE ONCOLOGY TABLE AND COMMENT BELOW CERVIX: - UNINVOLVED BY CARCINOMA BILATERAL OVARIES: - UNINVOLVED BY CARCINOMA BILATERAL FALLOPIAN TUBES: - UNINVOLVED BY CARCINOMA 2. Lymph node, sentinel, biopsy, left obturator - NO CARCINOMA IDENTIFIED IN ONE LYMPH NODE (0/1) - SEE COMMENT 3. Lymph node, sentinel, biopsy, left external iliac - NO CARCINOMA IDENTIFIED IN ONE LYMPH NODE (0/1) - SEE COMMENT 4. Lymph node, sentinel, biopsy, right obturator - NO LYMPHOID TISSUE IDENTIFIED 5. Lymph node, sentinel, biopsy, right external iliac - NO CARCINOMA IDENTIFIED IN ONE LYMPH NODE (0/1)   Endometrial cancer (HCC)  12/27/2017 Initial Diagnosis   Endometrial cancer (HCC)     Interval History: She denies any vaginal bleeding, abdominal/pelvic pain, cough, lethargy or abdominal distention.  However, she expresses concern about recent weight gain, attributing it to a lack of exercise. They express a desire to resume physical activity, specifically resistance and weight training. The patient denies any other health concerns at this time. She has no new symptoms and report no changes in their health status since their last visit.     Review of  Systems Review of Systems  Constitutional:  Negative for malaise/fatigue and weight loss.  Respiratory:  Negative for cough.   Gastrointestinal:  Negative for abdominal pain, nausea and vomiting.  Genitourinary:        No vaginal bleeding    Current Meds:  Outpatient Encounter Medications as of 02/21/2023  Medication Sig   acetaminophen  (TYLENOL ) 650 MG CR tablet Take 650 mg by mouth at bedtime.   amLODipine (NORVASC) 2.5 MG tablet    aspirin  EC 81 MG tablet Take 1 tablet (81 mg total) by mouth every evening. Begin taking 3 days after surgery   Calcium Carb-Cholecalciferol (CALTRATE 600+D3 PO) Take 1 tablet by mouth daily.   FLOWFLEX COVID-19 AG HOME TEST KIT Test AS DIRECTED FOR Covid-19   lisinopril  (PRINIVIL ,ZESTRIL ) 20 MG tablet Take 20 mg by mouth daily.   Multiple Vitamins-Minerals (PRESERVISION AREDS 2 PO) Take 1 tablet by mouth 2 (two) times daily.   pravastatin  (PRAVACHOL ) 10 MG tablet Take 10 mg by mouth daily after supper.    senna-docusate (SENOKOT-S) 8.6-50 MG tablet Take 1 tablet by mouth at bedtime. Stop if having loose stools   No facility-administered encounter medications on file as of 02/21/2023.    Allergy: No Known Allergies  Social Hx:   Social History   Socioeconomic History   Marital status: Single    Spouse name: Not on file   Number of children: Not on file   Years of education: Not on file   Highest education level: Not on file  Occupational History   Not on file  Tobacco Use   Smoking status: Never   Smokeless tobacco: Never  Vaping Use   Vaping status: Never Used  Substance and Sexual Activity   Alcohol  use: No   Drug use: No   Sexual activity: Not on file  Other Topics Concern   Not on file  Social History Narrative   Not on file   Social Drivers of Health   Financial Resource Strain: Not on file  Food Insecurity: Not on file  Transportation Needs: Not on file  Physical Activity: Not on file  Stress: Not on file  Social Connections:  Not on file  Intimate Partner Violence: Not on file    Past Surgical Hx:  Past Surgical History:  Procedure Laterality Date   CATARACT EXTRACTION Left 09/2019   CATARACT EXTRACTION Right 10/2019   COLONOSCOPY  2005   ENDOMETRIAL BIOPSY  11/07/2017   MOUTH SURGERY     [place] Teeth, about 93yrs ago   ROBOTIC ASSISTED TOTAL HYSTERECTOMY WITH BILATERAL SALPINGO OOPHERECTOMY Bilateral 12/27/2017   Procedure: XI ROBOTIC ASSISTED TOTAL HYSTERECTOMY WITH BILATERAL SALPINGO OOPHORECTOMY,  STAGING;  Surgeon: Anitra Freddy NOVAK, MD;  Location: [facility];  Service: Gynecology;  Laterality: Bilateral;   SENTINEL NODE BIOPSY N/A 12/27/2017   Procedure: SENTINEL NODE BIOPSY;  Surgeon: Anitra Freddy NOVAK, MD;  Location: [facility];  Service: Gynecology;  Laterality: N/A;    Past Medical Hx:  Past Medical History:  Diagnosis Date   Anesthesia    Difficulty waking up after surgery   Anxiety    Arthritis    Complication of anesthesia    slow to wake up with biopsy on 12/06/2017    Depression    Family history of adverse reaction to anesthesia    mother- slow to wake up    GERD (gastroesophageal reflux disease)    Hemorrhoids    Hyperlipidemia    Hypertension    Neuropathy    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 Diagosed by a urologist starts with a c and a type that women usually get   Breast cancer Neg Hx      Vitals:  BP (!) 144/60 (BP Location: Right Arm, Patient Position: Sitting)   Pulse 74   Temp 98.4 F (36.9 C) (Oral)   Resp 17   Wt 183 lb 12.8 oz (83.4 kg)   SpO2 100%   BMI 30.26 kg/m   Exam conducted with a chaperone present.  Constitutional:      General: She is not in acute distress. Cardiovascular:     Rate and Rhythm: Normal rate and regular rhythm.  Pulmonary:     Effort: Pulmonary effort is normal.     Breath sounds: Normal breath sounds. No wheezing or rhonchi.  Abdominal:     Palpations: Abdomen is soft.     Tenderness: There is no abdominal tenderness. There is no right CVA tenderness or left CVA tenderness.  Hernia: No hernia is present.  Genitourinary:    General: Normal vulva.     Urethra: No urethral lesion.     Vagina: No lesions. No bleeding Musculoskeletal:     Cervical back: Neck supple.     Right lower leg: No edema.     Left lower leg: No edema.  Lymphadenopathy:     Upper Body:     Right upper body: No supraclavicular adenopathy.     Left upper body: No supraclavicular adenopathy.     Lower Body: No right inguinal adenopathy. No left inguinal adenopathy.  Skin:    Findings: No rash.  Neurological:     Mental Status: She is oriented to person, place, and time.   Assessment/Plan: Endometrial cancer (HCC) 73 yo w/Stage IA Grade 1 EC Negative symptom review, normal exam.  NED   >f/u w/a generalist gynecologist is appropriate      I personally spent 25 minutes face-to-face and non-face-to-face in the care of this patient, which includes all pre, intra, and post visit time on the date of service.   Olam Mill, MD 02/21/2023, 8:48 PM

## 2023-02-21 NOTE — Assessment & Plan Note (Addendum)
 73 yo w/Stage IA Grade 1 EC Negative symptom review, normal exam.  NED   >f/u w/a generalist gynecologist is appropriate

## 2023-03-14 DIAGNOSIS — I10 Essential (primary) hypertension: Secondary | ICD-10-CM | POA: Diagnosis not present

## 2023-03-29 DIAGNOSIS — R52 Pain, unspecified: Secondary | ICD-10-CM | POA: Diagnosis not present

## 2023-03-29 DIAGNOSIS — E7800 Pure hypercholesterolemia, unspecified: Secondary | ICD-10-CM | POA: Diagnosis not present

## 2023-03-29 DIAGNOSIS — I10 Essential (primary) hypertension: Secondary | ICD-10-CM | POA: Diagnosis not present

## 2023-03-29 DIAGNOSIS — Z299 Encounter for prophylactic measures, unspecified: Secondary | ICD-10-CM | POA: Diagnosis not present

## 2023-04-13 DIAGNOSIS — I10 Essential (primary) hypertension: Secondary | ICD-10-CM | POA: Diagnosis not present

## 2023-05-13 DIAGNOSIS — I10 Essential (primary) hypertension: Secondary | ICD-10-CM | POA: Diagnosis not present

## 2023-05-28 DIAGNOSIS — H353122 Nonexudative age-related macular degeneration, left eye, intermediate dry stage: Secondary | ICD-10-CM | POA: Diagnosis not present

## 2023-06-13 DIAGNOSIS — I10 Essential (primary) hypertension: Secondary | ICD-10-CM | POA: Diagnosis not present

## 2023-07-14 DIAGNOSIS — I10 Essential (primary) hypertension: Secondary | ICD-10-CM | POA: Diagnosis not present

## 2023-07-24 DIAGNOSIS — R519 Headache, unspecified: Secondary | ICD-10-CM | POA: Diagnosis not present

## 2023-07-24 DIAGNOSIS — U071 COVID-19: Secondary | ICD-10-CM | POA: Diagnosis not present

## 2023-07-24 DIAGNOSIS — Z20822 Contact with and (suspected) exposure to covid-19: Secondary | ICD-10-CM | POA: Diagnosis not present

## 2023-07-30 DIAGNOSIS — I10 Essential (primary) hypertension: Secondary | ICD-10-CM | POA: Diagnosis not present

## 2023-07-30 DIAGNOSIS — Z299 Encounter for prophylactic measures, unspecified: Secondary | ICD-10-CM | POA: Diagnosis not present

## 2023-07-30 DIAGNOSIS — R059 Cough, unspecified: Secondary | ICD-10-CM | POA: Diagnosis not present

## 2023-07-30 DIAGNOSIS — R0981 Nasal congestion: Secondary | ICD-10-CM | POA: Diagnosis not present

## 2023-08-13 DIAGNOSIS — I10 Essential (primary) hypertension: Secondary | ICD-10-CM | POA: Diagnosis not present

## 2023-09-13 DIAGNOSIS — I10 Essential (primary) hypertension: Secondary | ICD-10-CM | POA: Diagnosis not present

## 2023-09-27 DIAGNOSIS — E7800 Pure hypercholesterolemia, unspecified: Secondary | ICD-10-CM | POA: Diagnosis not present

## 2023-09-27 DIAGNOSIS — Z0000 Encounter for general adult medical examination without abnormal findings: Secondary | ICD-10-CM | POA: Diagnosis not present

## 2023-09-27 DIAGNOSIS — R002 Palpitations: Secondary | ICD-10-CM | POA: Diagnosis not present

## 2023-09-27 DIAGNOSIS — I10 Essential (primary) hypertension: Secondary | ICD-10-CM | POA: Diagnosis not present

## 2023-09-27 DIAGNOSIS — Z79899 Other long term (current) drug therapy: Secondary | ICD-10-CM | POA: Diagnosis not present

## 2023-09-27 DIAGNOSIS — E559 Vitamin D deficiency, unspecified: Secondary | ICD-10-CM | POA: Diagnosis not present

## 2023-09-27 DIAGNOSIS — Z1339 Encounter for screening examination for other mental health and behavioral disorders: Secondary | ICD-10-CM | POA: Diagnosis not present

## 2023-09-27 DIAGNOSIS — Z7189 Other specified counseling: Secondary | ICD-10-CM | POA: Diagnosis not present

## 2023-09-27 DIAGNOSIS — R5383 Other fatigue: Secondary | ICD-10-CM | POA: Diagnosis not present

## 2023-09-27 DIAGNOSIS — Z1331 Encounter for screening for depression: Secondary | ICD-10-CM | POA: Diagnosis not present

## 2023-09-27 DIAGNOSIS — Z299 Encounter for prophylactic measures, unspecified: Secondary | ICD-10-CM | POA: Diagnosis not present

## 2023-10-01 DIAGNOSIS — R9431 Abnormal electrocardiogram [ECG] [EKG]: Secondary | ICD-10-CM | POA: Diagnosis not present

## 2023-10-13 DIAGNOSIS — I10 Essential (primary) hypertension: Secondary | ICD-10-CM | POA: Diagnosis not present

## 2023-11-12 DIAGNOSIS — Z23 Encounter for immunization: Secondary | ICD-10-CM | POA: Diagnosis not present

## 2023-11-13 DIAGNOSIS — I10 Essential (primary) hypertension: Secondary | ICD-10-CM | POA: Diagnosis not present

## 2023-11-19 DIAGNOSIS — H353122 Nonexudative age-related macular degeneration, left eye, intermediate dry stage: Secondary | ICD-10-CM | POA: Diagnosis not present
